# Patient Record
Sex: Female | Born: 1935 | Race: White | Hispanic: No | Smoking: Never smoker
Health system: Southern US, Community
[De-identification: ages and names within clinical notes are randomized; demographics above are authoritative.]

## PROBLEM LIST (undated history)

## (undated) DIAGNOSIS — Z87440 Personal history of urinary (tract) infections: Secondary | ICD-10-CM

## (undated) DIAGNOSIS — R4182 Altered mental status, unspecified: Secondary | ICD-10-CM

## (undated) DIAGNOSIS — F0390 Unspecified dementia without behavioral disturbance: Secondary | ICD-10-CM

## (undated) DIAGNOSIS — R32 Unspecified urinary incontinence: Secondary | ICD-10-CM

## (undated) DIAGNOSIS — F329 Major depressive disorder, single episode, unspecified: Secondary | ICD-10-CM

## (undated) DIAGNOSIS — G43909 Migraine, unspecified, not intractable, without status migrainosus: Secondary | ICD-10-CM

## (undated) DIAGNOSIS — M1990 Unspecified osteoarthritis, unspecified site: Secondary | ICD-10-CM

## (undated) DIAGNOSIS — S4291XA Fracture of right shoulder girdle, part unspecified, initial encounter for closed fracture: Secondary | ICD-10-CM

## (undated) DIAGNOSIS — M6282 Rhabdomyolysis: Secondary | ICD-10-CM

## (undated) DIAGNOSIS — S72009A Fracture of unspecified part of neck of unspecified femur, initial encounter for closed fracture: Secondary | ICD-10-CM

## (undated) DIAGNOSIS — C55 Malignant neoplasm of uterus, part unspecified: Secondary | ICD-10-CM

## (undated) DIAGNOSIS — R159 Full incontinence of feces: Secondary | ICD-10-CM

## (undated) DIAGNOSIS — T148XXA Other injury of unspecified body region, initial encounter: Secondary | ICD-10-CM

## (undated) DIAGNOSIS — F32A Depression, unspecified: Secondary | ICD-10-CM

## (undated) DIAGNOSIS — S62102A Fracture of unspecified carpal bone, left wrist, initial encounter for closed fracture: Secondary | ICD-10-CM

## (undated) DIAGNOSIS — D649 Anemia, unspecified: Secondary | ICD-10-CM

## (undated) DIAGNOSIS — C801 Malignant (primary) neoplasm, unspecified: Secondary | ICD-10-CM

## (undated) DIAGNOSIS — M810 Age-related osteoporosis without current pathological fracture: Secondary | ICD-10-CM

## (undated) DIAGNOSIS — E039 Hypothyroidism, unspecified: Secondary | ICD-10-CM

## (undated) DIAGNOSIS — K2970 Gastritis, unspecified, without bleeding: Secondary | ICD-10-CM

## (undated) HISTORY — PX: ABDOMINAL HYSTERECTOMY: SHX81

## (undated) HISTORY — PX: EYE SURGERY: SHX253

## (undated) HISTORY — PX: FRACTURE SURGERY: SHX138

## (undated) HISTORY — PX: TONSILLECTOMY: SUR1361

---

## 2009-09-15 ENCOUNTER — Emergency Department (HOSPITAL_COMMUNITY): Admission: EM | Admit: 2009-09-15 | Discharge: 2009-09-15 | Payer: Self-pay | Admitting: Emergency Medicine

## 2010-02-14 ENCOUNTER — Inpatient Hospital Stay (HOSPITAL_COMMUNITY): Admission: EM | Admit: 2010-02-14 | Discharge: 2010-02-18 | Payer: Self-pay | Admitting: Emergency Medicine

## 2010-09-25 LAB — URINALYSIS, ROUTINE W REFLEX MICROSCOPIC
Bilirubin Urine: NEGATIVE
Ketones, ur: 40 mg/dL — AB
Nitrite: NEGATIVE
Nitrite: NEGATIVE
Protein, ur: 30 mg/dL — AB
Protein, ur: NEGATIVE mg/dL
Specific Gravity, Urine: 1.031 — ABNORMAL HIGH (ref 1.005–1.030)
Urobilinogen, UA: 1 mg/dL (ref 0.0–1.0)
Urobilinogen, UA: 1 mg/dL (ref 0.0–1.0)
pH: 5.5 (ref 5.0–8.0)

## 2010-09-25 LAB — DIFFERENTIAL
Eosinophils Absolute: 0 10*3/uL (ref 0.0–0.7)
Eosinophils Absolute: 0.1 10*3/uL (ref 0.0–0.7)
Eosinophils Relative: 0 % (ref 0–5)
Eosinophils Relative: 2 % (ref 0–5)
Lymphocytes Relative: 20 % (ref 12–46)
Lymphocytes Relative: 6 % — ABNORMAL LOW (ref 12–46)
Lymphs Abs: 0.6 10*3/uL — ABNORMAL LOW (ref 0.7–4.0)
Lymphs Abs: 1.3 10*3/uL (ref 0.7–4.0)
Monocytes Absolute: 1 10*3/uL (ref 0.1–1.0)
Monocytes Absolute: 1.3 10*3/uL — ABNORMAL HIGH (ref 0.1–1.0)
Monocytes Relative: 15 % — ABNORMAL HIGH (ref 3–12)

## 2010-09-25 LAB — CBC
HCT: 34.9 % — ABNORMAL LOW (ref 36.0–46.0)
Hemoglobin: 13.8 g/dL (ref 12.0–15.0)
MCH: 33.3 pg (ref 26.0–34.0)
MCHC: 34 g/dL (ref 30.0–36.0)
MCHC: 34.1 g/dL (ref 30.0–36.0)
MCV: 98.2 fL (ref 78.0–100.0)
MCV: 99.1 fL (ref 78.0–100.0)
RBC: 3.55 MIL/uL — ABNORMAL LOW (ref 3.87–5.11)
RBC: 4.07 MIL/uL (ref 3.87–5.11)
RDW: 15.2 % (ref 11.5–15.5)

## 2010-09-25 LAB — BASIC METABOLIC PANEL
CO2: 25 mEq/L (ref 19–32)
Chloride: 104 mEq/L (ref 96–112)
Creatinine, Ser: 0.66 mg/dL (ref 0.4–1.2)
Glucose, Bld: 98 mg/dL (ref 70–99)
Sodium: 136 mEq/L (ref 135–145)

## 2010-09-25 LAB — LIPID PANEL
Cholesterol: 134 mg/dL (ref 0–200)
HDL: 41 mg/dL (ref >39)
Total CHOL/HDL Ratio: 3.3 RATIO
VLDL: 21 mg/dL (ref 0–40)

## 2010-09-25 LAB — COMPREHENSIVE METABOLIC PANEL
ALT: 16 U/L (ref 0–35)
AST: 27 U/L (ref 0–37)
Albumin: 3 g/dL — ABNORMAL LOW (ref 3.5–5.2)
Creatinine, Ser: 0.95 mg/dL (ref 0.4–1.2)
GFR calc non Af Amer: 56 mL/min — ABNORMAL LOW (ref >60)
Sodium: 133 mEq/L — ABNORMAL LOW (ref 135–145)
Total Bilirubin: 1.1 mg/dL (ref 0.3–1.2)
Total Protein: 5.7 g/dL — ABNORMAL LOW (ref 6.0–8.3)

## 2010-09-25 LAB — VITAMIN B12: Vitamin B-12: 692 pg/mL (ref 211–911)

## 2010-09-25 LAB — MAGNESIUM: Magnesium: 1.9 mg/dL (ref 1.5–2.5)

## 2010-10-05 LAB — URINALYSIS, ROUTINE W REFLEX MICROSCOPIC
Glucose, UA: NEGATIVE mg/dL
Hgb urine dipstick: NEGATIVE
Specific Gravity, Urine: 1.022 (ref 1.005–1.030)
Urobilinogen, UA: 1 mg/dL (ref 0.0–1.0)
pH: 8 (ref 5.0–8.0)

## 2010-10-05 LAB — DIFFERENTIAL
Basophils Relative: 1 % (ref 0–1)
Eosinophils Absolute: 0.1 10*3/uL (ref 0.0–0.7)
Eosinophils Relative: 3 % (ref 0–5)
Monocytes Absolute: 0.6 10*3/uL (ref 0.1–1.0)
Monocytes Relative: 12 % (ref 3–12)

## 2010-10-05 LAB — BASIC METABOLIC PANEL
CO2: 29 mEq/L (ref 19–32)
Chloride: 107 mEq/L (ref 96–112)
GFR calc Af Amer: >60 mL/min (ref >60)
Sodium: 141 mEq/L (ref 135–145)

## 2010-10-05 LAB — CBC
HCT: 39.7 % (ref 36.0–46.0)
Hemoglobin: 13.6 g/dL (ref 12.0–15.0)
MCHC: 34.3 g/dL (ref 30.0–36.0)
MCV: 97.6 fL (ref 78.0–100.0)
RBC: 4.07 MIL/uL (ref 3.87–5.11)

## 2010-10-05 LAB — POCT CARDIAC MARKERS: CKMB, poc: <1 ng/mL — ABNORMAL LOW (ref 1.0–8.0)

## 2011-03-10 IMAGING — CR DG TIBIA/FIBULA 2V*R*
4 series · 4 of 4 positions shown · non-contrast
Comparison: None.

CLINICAL DATA: Bruising

RIGHT TIBIA AND FIBULA - 2 VIEW

[view not recorded (1 of 4)]
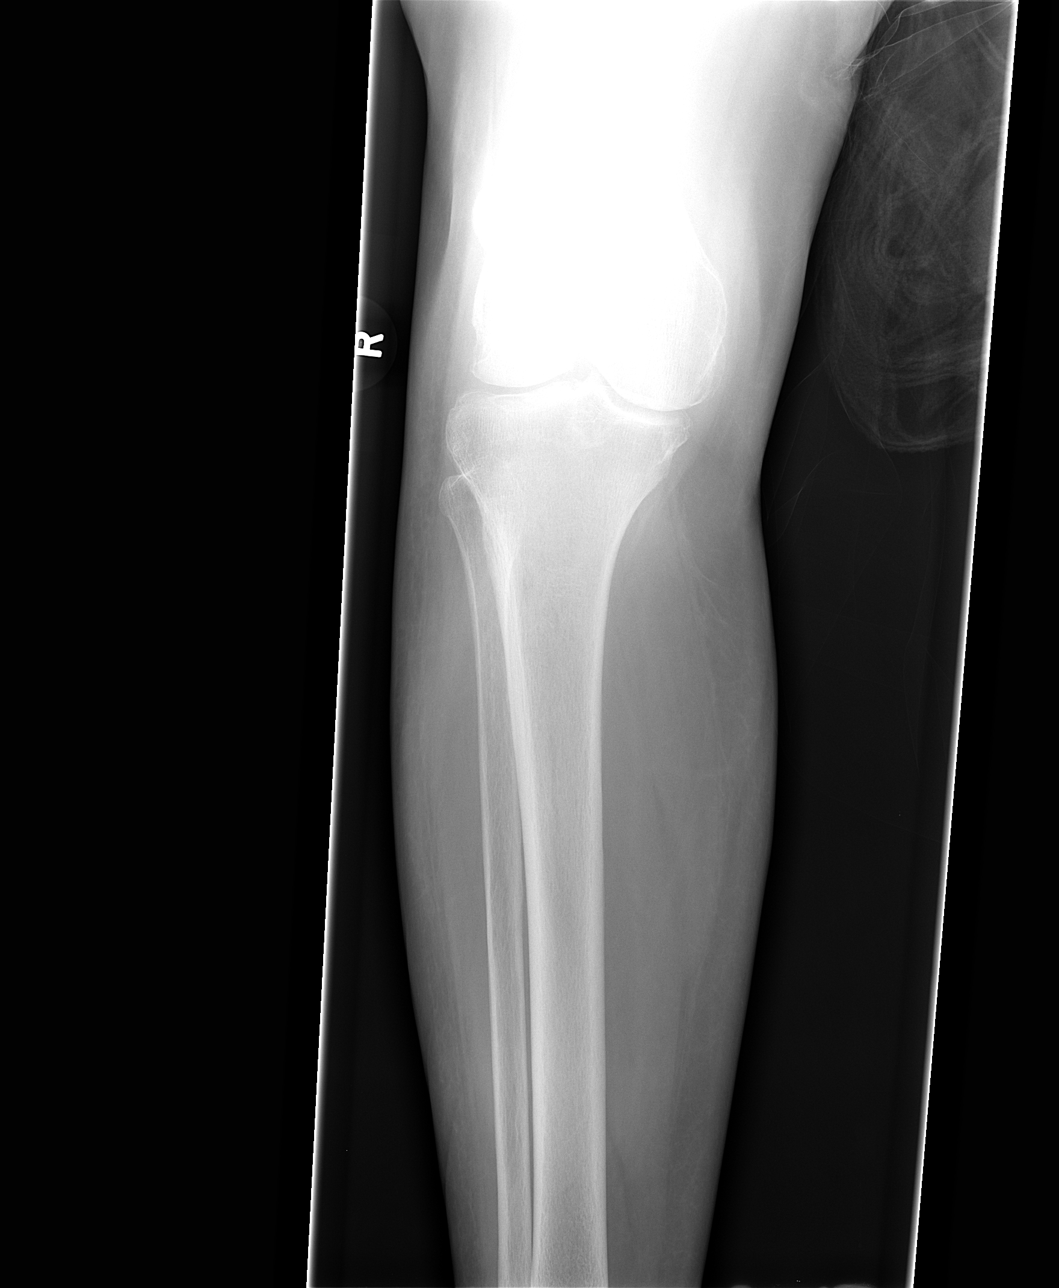

[view not recorded (2 of 4)]
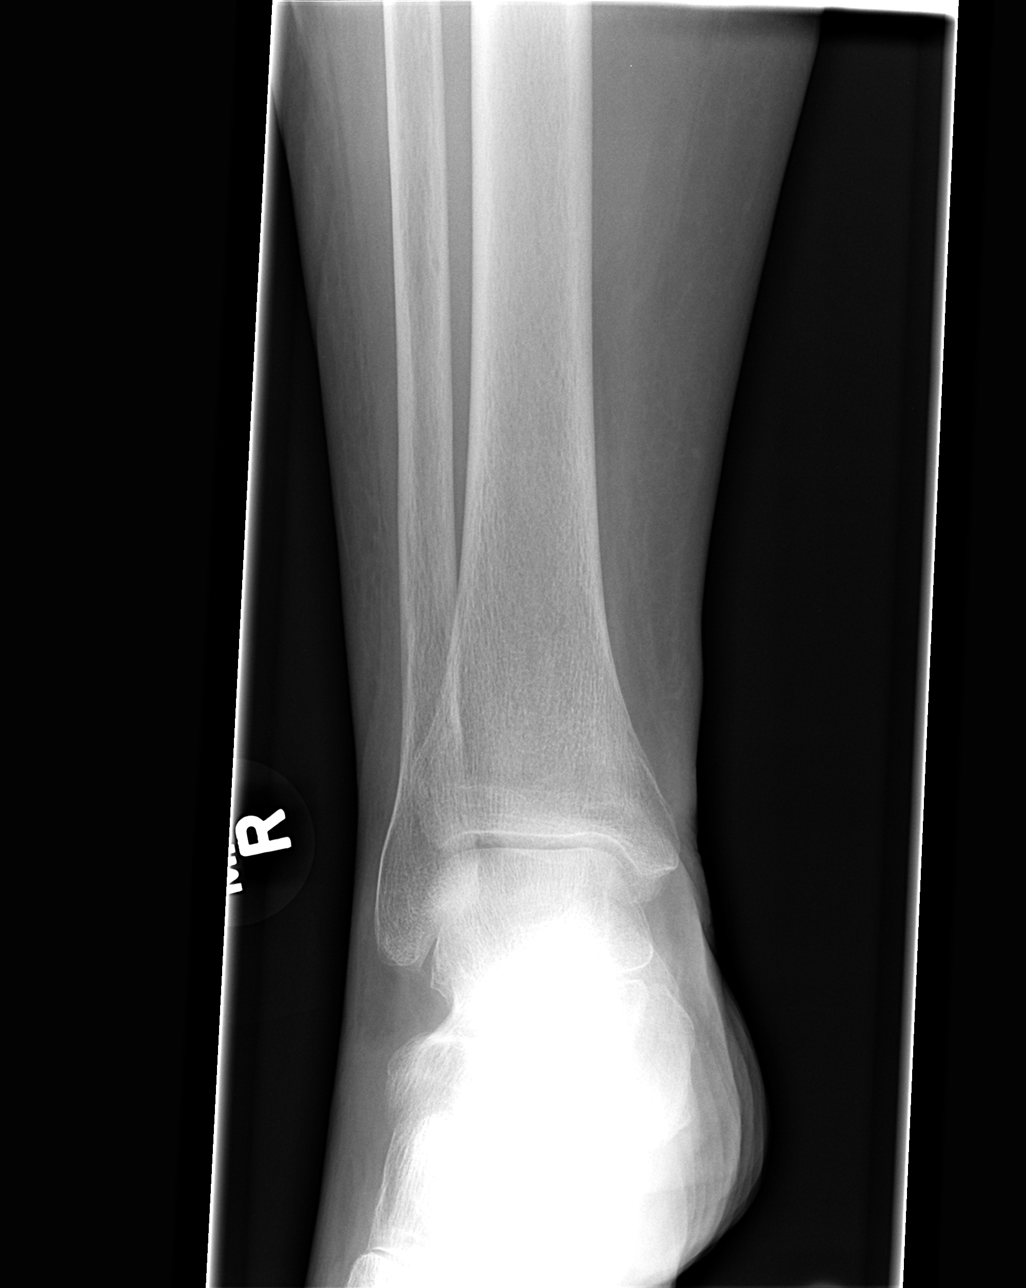

[view not recorded (3 of 4)]
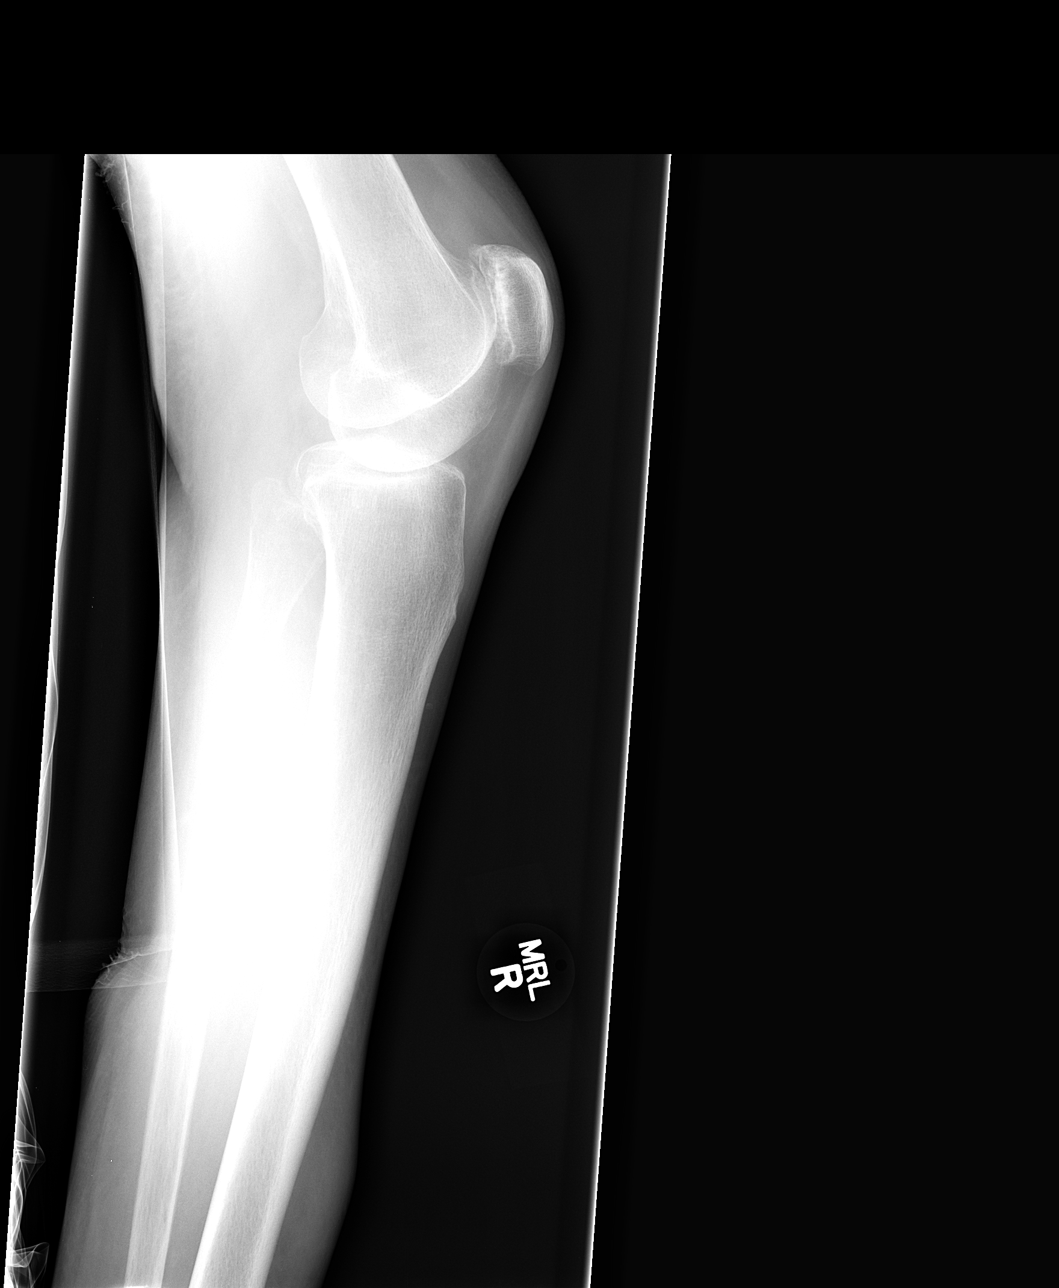

[view not recorded (4 of 4)]
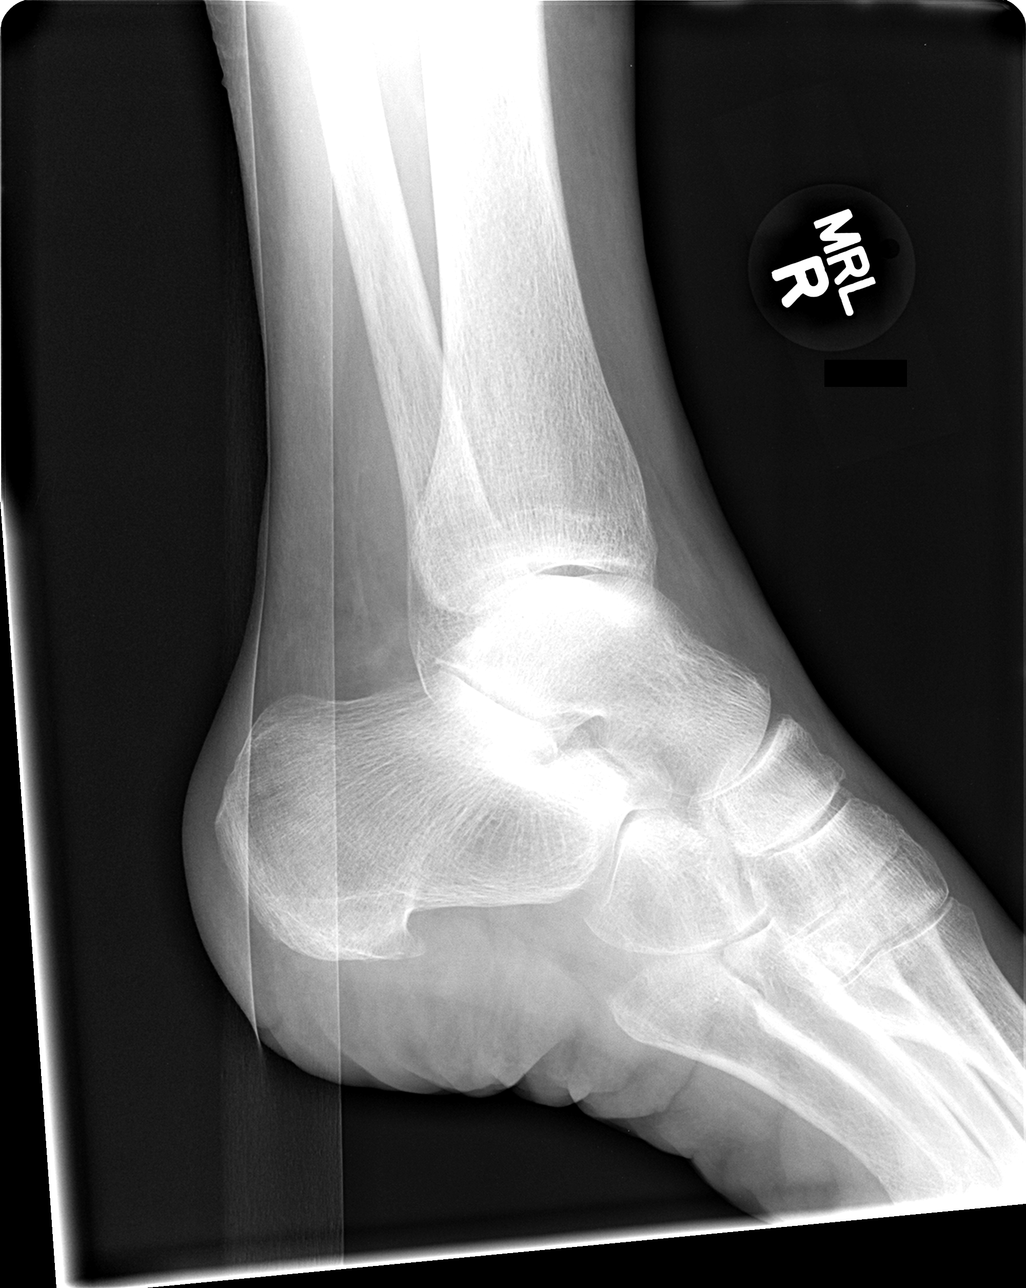

[4 of 4 positions shown; findings below may reference images not displayed]

FINDINGS: No radiodense foreign body or subcutaneous gas.  Small
calcaneal spur. Negative for fracture, dislocation, or other acute
abnormality.  Normal alignment and mineralization.  Regional soft
tissues unremarkable.
IMPRESSION: 1.  Negative for fracture or other acute bone injury.
2.  Small calcaneal spur.

## 2011-04-18 ENCOUNTER — Emergency Department (HOSPITAL_COMMUNITY)
Admission: EM | Admit: 2011-04-18 | Discharge: 2011-04-19 | Disposition: A | Discharge status: Other Acute Inpatient Hospital | Payer: Medicare Other | Attending: Emergency Medicine | Admitting: Emergency Medicine

## 2011-04-18 DIAGNOSIS — IMO0002 Reserved for concepts with insufficient information to code with codable children: Secondary | ICD-10-CM | POA: Insufficient documentation

## 2011-04-18 DIAGNOSIS — F603 Borderline personality disorder: Secondary | ICD-10-CM | POA: Insufficient documentation

## 2011-04-18 DIAGNOSIS — F068 Other specified mental disorders due to known physiological condition: Secondary | ICD-10-CM | POA: Insufficient documentation

## 2011-04-18 DIAGNOSIS — F329 Major depressive disorder, single episode, unspecified: Secondary | ICD-10-CM | POA: Insufficient documentation

## 2011-04-18 DIAGNOSIS — M810 Age-related osteoporosis without current pathological fracture: Secondary | ICD-10-CM | POA: Insufficient documentation

## 2011-04-18 DIAGNOSIS — E039 Hypothyroidism, unspecified: Secondary | ICD-10-CM | POA: Insufficient documentation

## 2011-04-18 DIAGNOSIS — F3289 Other specified depressive episodes: Secondary | ICD-10-CM | POA: Insufficient documentation

## 2011-04-18 DIAGNOSIS — Z79899 Other long term (current) drug therapy: Secondary | ICD-10-CM | POA: Insufficient documentation

## 2011-04-18 LAB — DIFFERENTIAL
Basophils Relative: 0 % (ref 0–1)
Monocytes Relative: 10 % (ref 3–12)
Neutro Abs: 5.2 10*3/uL (ref 1.7–7.7)
Neutrophils Relative %: 63 % (ref 43–77)

## 2011-04-18 LAB — COMPREHENSIVE METABOLIC PANEL
ALT: 10 U/L (ref 0–35)
Alkaline Phosphatase: 97 U/L (ref 39–117)
CO2: 28 mEq/L (ref 19–32)
Calcium: 10 mg/dL (ref 8.4–10.5)
GFR calc Af Amer: 48 mL/min — ABNORMAL LOW (ref >90)
GFR calc non Af Amer: 41 mL/min — ABNORMAL LOW (ref >90)
Glucose, Bld: 85 mg/dL (ref 70–99)
Potassium: 4.2 mEq/L (ref 3.5–5.1)
Sodium: 140 mEq/L (ref 135–145)
Total Bilirubin: 0.6 mg/dL (ref 0.3–1.2)

## 2011-04-18 LAB — CBC
Hemoglobin: 14.1 g/dL (ref 12.0–15.0)
MCH: 30.5 pg (ref 26.0–34.0)
RBC: 4.62 MIL/uL (ref 3.87–5.11)

## 2011-04-18 LAB — RAPID URINE DRUG SCREEN, HOSP PERFORMED
Amphetamines: NOT DETECTED
Tetrahydrocannabinol: NOT DETECTED

## 2011-04-18 LAB — URINALYSIS, ROUTINE W REFLEX MICROSCOPIC
Bilirubin Urine: NEGATIVE
Protein, ur: NEGATIVE mg/dL
Urobilinogen, UA: 0.2 mg/dL (ref 0.0–1.0)

## 2011-04-19 ENCOUNTER — Emergency Department (HOSPITAL_COMMUNITY): Payer: Medicare Other

## 2011-06-01 ENCOUNTER — Emergency Department (HOSPITAL_COMMUNITY): Payer: Medicare Other

## 2011-06-01 ENCOUNTER — Other Ambulatory Visit: Payer: Self-pay

## 2011-06-01 ENCOUNTER — Encounter: Payer: Self-pay | Admitting: Emergency Medicine

## 2011-06-01 ENCOUNTER — Encounter (HOSPITAL_COMMUNITY): Payer: Self-pay | Admitting: Anesthesiology

## 2011-06-01 ENCOUNTER — Encounter (HOSPITAL_COMMUNITY)
Admission: EM | Disposition: A | Discharge status: Skilled Nursing Facility | Payer: Self-pay | Source: Home / Self Care | Attending: Orthopaedic Surgery

## 2011-06-01 ENCOUNTER — Other Ambulatory Visit: Payer: Self-pay | Admitting: Physician Assistant

## 2011-06-01 ENCOUNTER — Inpatient Hospital Stay (HOSPITAL_COMMUNITY)
Admission: EM | Admit: 2011-06-01 | Discharge: 2011-06-07 | DRG: 482 | Disposition: A | Discharge status: Skilled Nursing Facility | Payer: Medicare Other | Attending: Orthopaedic Surgery | Admitting: Orthopaedic Surgery

## 2011-06-01 ENCOUNTER — Emergency Department (HOSPITAL_COMMUNITY): Payer: Medicare Other | Admitting: Anesthesiology

## 2011-06-01 DIAGNOSIS — Y9210 Unspecified residential institution as the place of occurrence of the external cause: Secondary | ICD-10-CM | POA: Diagnosis present

## 2011-06-01 DIAGNOSIS — Z87440 Personal history of urinary (tract) infections: Secondary | ICD-10-CM

## 2011-06-01 DIAGNOSIS — S7223XA Displaced subtrochanteric fracture of unspecified femur, initial encounter for closed fracture: Secondary | ICD-10-CM | POA: Diagnosis present

## 2011-06-01 DIAGNOSIS — R32 Unspecified urinary incontinence: Secondary | ICD-10-CM | POA: Diagnosis present

## 2011-06-01 DIAGNOSIS — S72143A Displaced intertrochanteric fracture of unspecified femur, initial encounter for closed fracture: Principal | ICD-10-CM | POA: Diagnosis present

## 2011-06-01 DIAGNOSIS — F0390 Unspecified dementia without behavioral disturbance: Secondary | ICD-10-CM | POA: Diagnosis present

## 2011-06-01 DIAGNOSIS — S72001A Fracture of unspecified part of neck of right femur, initial encounter for closed fracture: Secondary | ICD-10-CM | POA: Diagnosis present

## 2011-06-01 DIAGNOSIS — W010XXA Fall on same level from slipping, tripping and stumbling without subsequent striking against object, initial encounter: Secondary | ICD-10-CM | POA: Diagnosis present

## 2011-06-01 DIAGNOSIS — S72009A Fracture of unspecified part of neck of unspecified femur, initial encounter for closed fracture: Secondary | ICD-10-CM

## 2011-06-01 HISTORY — DX: Altered mental status, unspecified: R41.82

## 2011-06-01 HISTORY — DX: Unspecified dementia, unspecified severity, without behavioral disturbance, psychotic disturbance, mood disturbance, and anxiety: F03.90

## 2011-06-01 HISTORY — DX: Unspecified osteoarthritis, unspecified site: M19.90

## 2011-06-01 HISTORY — DX: Migraine, unspecified, not intractable, without status migrainosus: G43.909

## 2011-06-01 HISTORY — DX: Unspecified urinary incontinence: R32

## 2011-06-01 HISTORY — PX: FEMUR IM NAIL: SHX1597

## 2011-06-01 HISTORY — DX: Personal history of urinary (tract) infections: Z87.440

## 2011-06-01 HISTORY — DX: Major depressive disorder, single episode, unspecified: F32.9

## 2011-06-01 HISTORY — DX: Hypothyroidism, unspecified: E03.9

## 2011-06-01 HISTORY — DX: Other injury of unspecified body region, initial encounter: T14.8XXA

## 2011-06-01 HISTORY — DX: Full incontinence of feces: R15.9

## 2011-06-01 HISTORY — DX: Depression, unspecified: F32.A

## 2011-06-01 HISTORY — DX: Gastritis, unspecified, without bleeding: K29.70

## 2011-06-01 LAB — DIFFERENTIAL
Basophils Absolute: 0 10*3/uL (ref 0.0–0.1)
Basophils Relative: 1 % (ref 0–1)
Lymphocytes Relative: 21 % (ref 12–46)
Monocytes Absolute: 0.8 10*3/uL (ref 0.1–1.0)
Neutro Abs: 5.6 10*3/uL (ref 1.7–7.7)
Neutrophils Relative %: 64 % (ref 43–77)

## 2011-06-01 LAB — BASIC METABOLIC PANEL
CO2: 27 mEq/L (ref 19–32)
Chloride: 102 mEq/L (ref 96–112)
GFR calc Af Amer: 58 mL/min — ABNORMAL LOW (ref >90)
Potassium: 3.8 mEq/L (ref 3.5–5.1)
Sodium: 140 mEq/L (ref 135–145)

## 2011-06-01 LAB — PROTIME-INR
INR: 0.97 (ref 0.00–1.49)
Prothrombin Time: 13.1 seconds (ref 11.6–15.2)

## 2011-06-01 LAB — CBC
HCT: 41.7 % (ref 36.0–46.0)
Platelets: 223 10*3/uL (ref 150–400)
RDW: 15.4 % (ref 11.5–15.5)
WBC: 8.8 10*3/uL (ref 4.0–10.5)

## 2011-06-01 SURGERY — INSERTION, INTRAMEDULLARY ROD, FEMUR
Anesthesia: General | Site: Hip | Laterality: Right | Wound class: Clean

## 2011-06-01 MED ORDER — WARFARIN SODIUM 5 MG PO TABS
5.0000 mg | ORAL_TABLET | Freq: Once | ORAL | Status: AC
  Administered 2011-06-02: 5 mg via ORAL
  Filled 2011-06-01: qty 1

## 2011-06-01 MED ORDER — HYDROMORPHONE HCL PF 2 MG/ML IJ SOLN
INTRAMUSCULAR | Status: AC
  Administered 2011-06-01: 1 mg via INTRAVENOUS
  Filled 2011-06-01: qty 1

## 2011-06-01 MED ORDER — ARIPIPRAZOLE 5 MG PO TABS
5.0000 mg | ORAL_TABLET | Freq: Every day | ORAL | Status: DC
  Administered 2011-06-02 – 2011-06-07 (×6): 5 mg via ORAL
  Filled 2011-06-01 (×7): qty 1

## 2011-06-01 MED ORDER — HYDROCODONE-ACETAMINOPHEN 5-325 MG PO TABS
1.0000 | ORAL_TABLET | ORAL | Status: DC | PRN
  Administered 2011-06-05 – 2011-06-07 (×4): 1 via ORAL
  Filled 2011-06-01 (×4): qty 1

## 2011-06-01 MED ORDER — BISACODYL 10 MG RE SUPP: 10.0000 mg | Freq: Every day | RECTAL | Status: DC | PRN

## 2011-06-01 MED ORDER — METOCLOPRAMIDE HCL 10 MG PO TABS: 5.0000 mg | ORAL_TABLET | Freq: Three times a day (TID) | ORAL | Status: DC | PRN

## 2011-06-01 MED ORDER — DIVALPROEX SODIUM ER 500 MG PO TB24
500.0000 mg | ORAL_TABLET | Freq: Every day | ORAL | Status: DC
  Administered 2011-06-01 – 2011-06-06 (×6): 500 mg via ORAL
  Filled 2011-06-01 (×9): qty 1

## 2011-06-01 MED ORDER — ONDANSETRON HCL 4 MG/2ML IJ SOLN
INTRAMUSCULAR | Status: DC | PRN
  Administered 2011-06-01: 4 mg via INTRAVENOUS

## 2011-06-01 MED ORDER — METHOCARBAMOL 100 MG/ML IJ SOLN: 500.0000 mg | Freq: Four times a day (QID) | INTRAVENOUS | Status: DC | PRN

## 2011-06-01 MED ORDER — ACETAMINOPHEN 325 MG PO TABS
650.0000 mg | ORAL_TABLET | Freq: Four times a day (QID) | ORAL | Status: DC | PRN
  Administered 2011-06-02 – 2011-06-04 (×5): 650 mg via ORAL
  Filled 2011-06-01 (×5): qty 2

## 2011-06-01 MED ORDER — LORAZEPAM 0.5 MG PO TABS
0.5000 mg | ORAL_TABLET | Freq: Two times a day (BID) | ORAL | Status: DC
  Administered 2011-06-01 – 2011-06-07 (×8): 0.5 mg via ORAL
  Filled 2011-06-01 (×10): qty 1

## 2011-06-01 MED ORDER — MORPHINE SULFATE 2 MG/ML IJ SOLN
0.5000 mg | INTRAMUSCULAR | Status: DC | PRN
  Administered 2011-06-02: 0.5 mg via INTRAVENOUS
  Filled 2011-06-01: qty 1

## 2011-06-01 MED ORDER — HYDROCORTISONE 2.5 % RE CREA: 1.0000 "application " | TOPICAL_CREAM | RECTAL | Status: DC

## 2011-06-01 MED ORDER — ONDANSETRON HCL 4 MG/2ML IJ SOLN
4.0000 mg | Freq: Four times a day (QID) | INTRAMUSCULAR | Status: DC | PRN
Start: 2011-06-01 — End: 2011-06-03

## 2011-06-01 MED ORDER — LACTATED RINGERS IV SOLN: INTRAVENOUS | Status: DC

## 2011-06-01 MED ORDER — LACTATED RINGERS IV SOLN
INTRAVENOUS | Status: DC | PRN
  Administered 2011-06-01 (×2): via INTRAVENOUS

## 2011-06-01 MED ORDER — METHOCARBAMOL 100 MG/ML IJ SOLN
500.0000 mg | Freq: Four times a day (QID) | INTRAVENOUS | Status: DC | PRN
  Filled 2011-06-01: qty 5

## 2011-06-01 MED ORDER — ONDANSETRON HCL 4 MG/2ML IJ SOLN
4.0000 mg | Freq: Once | INTRAMUSCULAR | Status: AC
  Administered 2011-06-01: 4 mg via INTRAVENOUS
  Filled 2011-06-01: qty 2

## 2011-06-01 MED ORDER — HYDROMORPHONE HCL PF 1 MG/ML IJ SOLN: 1.0000 mg | Freq: Once | INTRAMUSCULAR | Status: DC

## 2011-06-01 MED ORDER — SODIUM CHLORIDE 0.9 % IV SOLN: INTRAVENOUS | Status: DC

## 2011-06-01 MED ORDER — CALCIUM CARBONATE-VITAMIN D 500-200 MG-UNIT PO TABS
1.0000 | ORAL_TABLET | Freq: Every day | ORAL | Status: DC
  Administered 2011-06-01 – 2011-06-07 (×7): 1 via ORAL
  Filled 2011-06-01 (×8): qty 1

## 2011-06-01 MED ORDER — DONEPEZIL HCL 10 MG PO TABS
10.0000 mg | ORAL_TABLET | Freq: Every day | ORAL | Status: DC
  Administered 2011-06-01 – 2011-06-06 (×6): 10 mg via ORAL
  Filled 2011-06-01 (×8): qty 1

## 2011-06-01 MED ORDER — POLYETHYLENE GLYCOL 3350 17 G PO PACK
17.0000 g | PACK | Freq: Every day | ORAL | Status: DC | PRN
  Filled 2011-06-01: qty 1

## 2011-06-01 MED ORDER — METOCLOPRAMIDE HCL 5 MG/ML IJ SOLN: 5.0000 mg | Freq: Three times a day (TID) | INTRAMUSCULAR | Status: DC | PRN

## 2011-06-01 MED ORDER — ACETAMINOPHEN 10 MG/ML IV SOLN
INTRAVENOUS | Status: AC
  Filled 2011-06-01: qty 100

## 2011-06-01 MED ORDER — CEFAZOLIN SODIUM 1-5 GM-% IV SOLN
1.0000 g | INTRAVENOUS | Status: AC
  Administered 2011-06-01: 1 g via INTRAVENOUS

## 2011-06-01 MED ORDER — CEFAZOLIN SODIUM 1-5 GM-% IV SOLN
INTRAVENOUS | Status: AC
  Filled 2011-06-01: qty 50

## 2011-06-01 MED ORDER — VITAMIN B-12 1000 MCG PO TABS
1000.0000 ug | ORAL_TABLET | Freq: Two times a day (BID) | ORAL | Status: DC
  Administered 2011-06-01 – 2011-06-07 (×12): 1000 ug via ORAL
  Filled 2011-06-01 (×16): qty 1

## 2011-06-01 MED ORDER — PHENOL 1.4 % MT LIQD: 1.0000 | OROMUCOSAL | Status: DC | PRN

## 2011-06-01 MED ORDER — HYDROMORPHONE HCL PF 1 MG/ML IJ SOLN: 0.2500 mg | INTRAMUSCULAR | Status: DC | PRN

## 2011-06-01 MED ORDER — MAGNESIUM HYDROXIDE 400 MG/5ML PO SUSP: 30.0000 mL | Freq: Two times a day (BID) | ORAL | Status: DC | PRN

## 2011-06-01 MED ORDER — PROPOFOL 10 MG/ML IV EMUL
INTRAVENOUS | Status: DC | PRN
  Administered 2011-06-01: 40 mg via INTRAVENOUS
  Administered 2011-06-01: 120 mg via INTRAVENOUS
  Administered 2011-06-01: 40 mg via INTRAVENOUS
  Administered 2011-06-01: 20 mg via INTRAVENOUS

## 2011-06-01 MED ORDER — DIVALPROEX SODIUM ER 250 MG PO TB24
250.0000 mg | ORAL_TABLET | Freq: Every day | ORAL | Status: DC
  Administered 2011-06-02 – 2011-06-07 (×6): 250 mg via ORAL
  Filled 2011-06-01 (×7): qty 1

## 2011-06-01 MED ORDER — FENTANYL CITRATE 0.05 MG/ML IJ SOLN
50.0000 ug | Freq: Once | INTRAMUSCULAR | Status: AC
  Administered 2011-06-01: 50 ug via INTRAVENOUS
  Filled 2011-06-01: qty 2

## 2011-06-01 MED ORDER — LIDOCAINE HCL (CARDIAC) 20 MG/ML IV SOLN
INTRAVENOUS | Status: DC | PRN
  Administered 2011-06-01: 60 mg via INTRAVENOUS

## 2011-06-01 MED ORDER — ACETAMINOPHEN 650 MG RE SUPP: 650.0000 mg | Freq: Four times a day (QID) | RECTAL | Status: DC | PRN

## 2011-06-01 MED ORDER — ASPIRIN 81 MG PO CHEW
81.0000 mg | CHEWABLE_TABLET | Freq: Every day | ORAL | Status: DC
  Administered 2011-06-01 – 2011-06-07 (×7): 81 mg via ORAL
  Filled 2011-06-01 (×8): qty 1

## 2011-06-01 MED ORDER — SERTRALINE HCL 100 MG PO TABS
100.0000 mg | ORAL_TABLET | Freq: Every day | ORAL | Status: DC
  Administered 2011-06-01 – 2011-06-03 (×3): 100 mg via ORAL
  Administered 2011-06-04: 23:00:00 via ORAL
  Administered 2011-06-05 – 2011-06-06 (×2): 100 mg via ORAL
  Filled 2011-06-01 (×8): qty 1

## 2011-06-01 MED ORDER — PATIENT'S GUIDE TO USING COUMADIN BOOK
Freq: Once | Status: AC
  Administered 2011-06-01: 23:00:00
  Filled 2011-06-01: qty 1

## 2011-06-01 MED ORDER — EPHEDRINE SULFATE 50 MG/ML IJ SOLN
INTRAMUSCULAR | Status: DC | PRN
  Administered 2011-06-01: 10 mg via INTRAVENOUS
  Administered 2011-06-01 (×10): 5 mg via INTRAVENOUS

## 2011-06-01 MED ORDER — MIRTAZAPINE 15 MG PO TABS
15.0000 mg | ORAL_TABLET | Freq: Every day | ORAL | Status: DC
  Administered 2011-06-01 – 2011-06-06 (×6): 15 mg via ORAL
  Filled 2011-06-01 (×8): qty 1

## 2011-06-01 MED ORDER — BISACODYL 5 MG PO TBEC: 10.0000 mg | DELAYED_RELEASE_TABLET | Freq: Every day | ORAL | Status: DC | PRN

## 2011-06-01 MED ORDER — DOCUSATE SODIUM 100 MG PO CAPS
100.0000 mg | ORAL_CAPSULE | Freq: Two times a day (BID) | ORAL | Status: DC
  Administered 2011-06-01 – 2011-06-07 (×12): 100 mg via ORAL
  Filled 2011-06-01 (×15): qty 1

## 2011-06-01 MED ORDER — SUCRALFATE 1 G PO TABS
1.0000 g | ORAL_TABLET | Freq: Two times a day (BID) | ORAL | Status: DC
  Administered 2011-06-01 – 2011-06-07 (×12): 1 g via ORAL
  Filled 2011-06-01 (×15): qty 1

## 2011-06-01 MED ORDER — CEFAZOLIN SODIUM 1-5 GM-% IV SOLN
1.0000 g | Freq: Four times a day (QID) | INTRAVENOUS | Status: AC
  Administered 2011-06-01 – 2011-06-02 (×3): 1 g via INTRAVENOUS
  Filled 2011-06-01 (×3): qty 50

## 2011-06-01 MED ORDER — ESMOLOL HCL 10 MG/ML IV SOLN
INTRAVENOUS | Status: DC | PRN
  Administered 2011-06-01: 20 mg via INTRAVENOUS

## 2011-06-01 MED ORDER — METOCLOPRAMIDE HCL 5 MG/ML IJ SOLN
INTRAMUSCULAR | Status: DC | PRN
  Administered 2011-06-01: 10 mg via INTRAVENOUS

## 2011-06-01 MED ORDER — SUCCINYLCHOLINE CHLORIDE 20 MG/ML IJ SOLN
INTRAMUSCULAR | Status: DC | PRN
  Administered 2011-06-01: 100 mg via INTRAVENOUS

## 2011-06-01 MED ORDER — METHOCARBAMOL 500 MG PO TABS: 500.0000 mg | ORAL_TABLET | Freq: Four times a day (QID) | ORAL | Status: DC | PRN

## 2011-06-01 MED ORDER — PROMETHAZINE HCL 25 MG/ML IJ SOLN: 6.2500 mg | INTRAMUSCULAR | Status: DC | PRN

## 2011-06-01 MED ORDER — LORAZEPAM 0.5 MG PO TABS: 0.5000 mg | ORAL_TABLET | Freq: Two times a day (BID) | ORAL | Status: DC | PRN

## 2011-06-01 MED ORDER — MENTHOL 3 MG MT LOZG: 1.0000 | LOZENGE | OROMUCOSAL | Status: DC | PRN

## 2011-06-01 MED ORDER — WARFARIN VIDEO
Freq: Once | Status: AC
  Administered 2011-06-02: 12:00:00
  Filled 2011-06-01: qty 1

## 2011-06-01 MED ORDER — MEMANTINE HCL 10 MG PO TABS
10.0000 mg | ORAL_TABLET | Freq: Two times a day (BID) | ORAL | Status: DC
  Administered 2011-06-01 – 2011-06-07 (×12): 10 mg via ORAL
  Filled 2011-06-01 (×15): qty 1

## 2011-06-01 MED ORDER — ALUM & MAG HYDROXIDE-SIMETH 200-200-20 MG/5ML PO SUSP: 30.0000 mL | ORAL | Status: DC | PRN

## 2011-06-01 MED ORDER — ONDANSETRON HCL 4 MG PO TABS: 4.0000 mg | ORAL_TABLET | Freq: Four times a day (QID) | ORAL | Status: DC | PRN

## 2011-06-01 MED ORDER — LEVOTHYROXINE SODIUM 150 MCG PO TABS
150.0000 ug | ORAL_TABLET | Freq: Every day | ORAL | Status: DC
  Administered 2011-06-01 – 2011-06-07 (×6): 150 ug via ORAL
  Filled 2011-06-01 (×7): qty 1

## 2011-06-01 MED ORDER — FENTANYL CITRATE 0.05 MG/ML IJ SOLN
INTRAMUSCULAR | Status: DC | PRN
  Administered 2011-06-01: 100 ug via INTRAVENOUS
  Administered 2011-06-01 (×3): 50 ug via INTRAVENOUS

## 2011-06-01 MED ORDER — ACETAMINOPHEN 10 MG/ML IV SOLN
INTRAVENOUS | Status: DC | PRN
  Administered 2011-06-01: 1000 mg via INTRAVENOUS

## 2011-06-01 MED ORDER — ENOXAPARIN SODIUM 40 MG/0.4ML ~~LOC~~ SOLN
40.0000 mg | SUBCUTANEOUS | Status: DC
  Administered 2011-06-02 – 2011-06-03 (×2): 40 mg via SUBCUTANEOUS
  Filled 2011-06-01 (×3): qty 0.4

## 2011-06-01 MED ORDER — FLEET ENEMA 7-19 GM/118ML RE ENEM: 1.0000 | ENEMA | Freq: Every day | RECTAL | Status: DC | PRN

## 2011-06-01 MED ORDER — SODIUM CHLORIDE 0.9 % IV SOLN
Freq: Once | INTRAVENOUS | Status: AC
  Administered 2011-06-01: 09:00:00 via INTRAVENOUS

## 2011-06-01 MED ORDER — HYDROMORPHONE HCL PF 2 MG/ML IJ SOLN: 0.5000 mg | INTRAMUSCULAR | Status: DC | PRN

## 2011-06-01 SURGICAL SUPPLY — 36 items
BAG ZIPLOCK 12X15 (MISCELLANEOUS) ×2 IMPLANT
BANDAGE GAUZE ELAST BULKY 4 IN (GAUZE/BANDAGES/DRESSINGS) ×2 IMPLANT
BIT DRILL 4.3MMS DISTAL GRDTED (BIT) ×1 IMPLANT
CLOTH BEACON ORANGE TIMEOUT ST (SAFETY) ×2 IMPLANT
COVER MAYO STAND STRL (DRAPES) ×2 IMPLANT
DRAPE STERI IOBAN 125X83 (DRAPES) ×2 IMPLANT
DRILL 4.3MMS DISTAL GRADUATED (BIT) ×2
DRSG PAD ABDOMINAL 8X10 ST (GAUZE/BANDAGES/DRESSINGS) ×4 IMPLANT
DURAPREP 26ML APPLICATOR (WOUND CARE) ×2 IMPLANT
ELECT REM PT RETURN 9FT ADLT (ELECTROSURGICAL) ×2
ELECTRODE REM PT RTRN 9FT ADLT (ELECTROSURGICAL) ×1 IMPLANT
FACESHIELD LNG OPTICON STERILE (SAFETY) ×4 IMPLANT
GLOVE ORTHO TXT STRL SZ7.5 (GLOVE) ×2 IMPLANT
GOWN PREVENTION PLUS LG XLONG (DISPOSABLE) ×2 IMPLANT
GUIDEPIN 3.2X17.5 THRD DISP (PIN) ×2 IMPLANT
GUIDEWIRE BALL NOSE 80CM (WIRE) ×2 IMPLANT
HFN RH 130 DEG 9MM X 360MM (Nail) ×2 IMPLANT
HIP FR NAIL LAG SCREW 10.5X110 (Orthopedic Implant) ×2 IMPLANT
NS IRRIG 1000ML POUR BTL (IV SOLUTION) ×2 IMPLANT
PACK GENERAL/GYN (CUSTOM PROCEDURE TRAY) ×2 IMPLANT
PAD CAST 4YDX4 CTTN HI CHSV (CAST SUPPLIES) ×1 IMPLANT
PADDING CAST COTTON 4X4 STRL (CAST SUPPLIES) ×1
PADDING CAST COTTON 6X4 STRL (CAST SUPPLIES) ×2 IMPLANT
POSITIONER SURGICAL ARM (MISCELLANEOUS) ×2 IMPLANT
SCREW BONE CORTICAL 5.0X44 (Screw) ×2 IMPLANT
SCREW LAG HIP FR NAIL 10.5X110 (Orthopedic Implant) ×1 IMPLANT
SPONGE GAUZE 4X4 12PLY (GAUZE/BANDAGES/DRESSINGS) ×2 IMPLANT
STAPLER VISISTAT 35W (STAPLE) ×2 IMPLANT
SUT VIC AB 1 CT1 27 (SUTURE) ×2
SUT VIC AB 1 CT1 27XBRD ANTBC (SUTURE) ×2 IMPLANT
SUT VIC AB 2-0 CT1 27 (SUTURE) ×2
SUT VIC AB 2-0 CT1 27XBRD (SUTURE) ×2 IMPLANT
TAPE HYPAFIX 4 X10 (GAUZE/BANDAGES/DRESSINGS) ×2 IMPLANT
TOWEL OR 17X26 10 PK STRL BLUE (TOWEL DISPOSABLE) ×2 IMPLANT
TRAY FOLEY CATH 14FRSI W/METER (CATHETERS) ×2 IMPLANT
WATER STERILE IRR 1500ML POUR (IV SOLUTION) ×2 IMPLANT

## 2011-06-01 NOTE — Preoperative (Signed)
Beta Blockers   Reason not to administer Beta Blockers:Not Applicable, pt does not take home BB

## 2011-06-01 NOTE — Brief Op Note (Signed)
06/01/2011  7:12 PM  PATIENT:  Bonnie Riley  77 y.o. female  PRE-OPERATIVE DIAGNOSIS:  Right Hip Fracture  POST-OPERATIVE DIAGNOSIS:  Right Hip Fracture  PROCEDURE:  Procedure(s): INTRAMEDULLARY (IM) NAIL FEMORAL (intramedullary hip screw biomet 9 X 360 nail 110 lag screw 44mm distal interlock)  SURGEON:  Surgeon(s): Eldred Manges  PHYSICIAN ASSISTANT:   ASSISTANTS: none   ANESTHESIA:   general  EBL:     BLOOD ADMINISTERED:none  DRAINS: none   LOCAL MEDICATIONS USED:  NONE  SPECIMEN:  No Specimen  DISPOSITION OF SPECIMEN:  N/A  COUNTS:  YES  TOURNIQUET:  * No tourniquets in log *  DICTATION: .Other Dictation: Dictation Number [id number]  PLAN OF CARE: Admit to inpatient   PATIENT DISPOSITION:  PACU - hemodynamically stable.   Delay start of Pharmacological VTE agent (>24hrs) due to surgical blood loss or risk of bleeding:  {YES/NO/NOT APPLICABLE:20182

## 2011-06-01 NOTE — ED Notes (Signed)
Fell in the bathroom this am and hit the right hip on the floor. Outward rotation and shortening noted. A&O x4. Pulse in right foot strong.

## 2011-06-01 NOTE — ED Notes (Signed)
Patient transported to X-ray 

## 2011-06-01 NOTE — Anesthesia Postprocedure Evaluation (Signed)
  Anesthesia Post-op Note  Patient: Bonnie Riley  Procedure(s) Performed:  INTRAMEDULLARY (IM) NAIL FEMORAL  Patient Location: PACU  Anesthesia Type: General  Level of Consciousness: awake and alert   Airway and Oxygen Therapy: Patient Spontanous Breathing  Post-op Pain: mild  Post-op Assessment: Post-op Vital signs reviewed, Patient's Cardiovascular Status Stable, Respiratory Function Stable, Patent Airway and No signs of Nausea or vomiting  Post-op Vital Signs: stable  Complications: No apparent anesthesia complications

## 2011-06-01 NOTE — Anesthesia Preprocedure Evaluation (Addendum)
Anesthesia Evaluation  Patient identified by MRN, date of birth, ID band Patient awake    Reviewed: Allergy & Precautions, H&P , NPO status , Patient's Chart, lab work & pertinent test results, reviewed documented beta blocker date and time   Airway Mallampati: II TM Distance: >3 FB Neck ROM: full    Dental No notable dental hx. (+) Teeth Intact and Dental Advisory Given   Pulmonary neg pulmonary ROS,  clear to auscultation  Pulmonary exam normal       Cardiovascular Exercise Tolerance: Good neg cardio ROS regular Normal    Neuro/Psych dementia Negative Neurological ROS  Negative Psych ROS   GI/Hepatic negative GI ROS, Neg liver ROS,   Endo/Other  Negative Endocrine ROS  Renal/GU negative Renal ROS  Genitourinary negative   Musculoskeletal   Abdominal Normal abdominal exam  (+)   Peds  Hematology negative hematology ROS (+)   Anesthesia Other Findings   Reproductive/Obstetrics negative OB ROS                         Anesthesia Physical Anesthesia Plan  ASA: II  Anesthesia Plan: General   Post-op Pain Management:    Induction: Intravenous  Airway Management Planned: Oral ETT  Additional Equipment:   Intra-op Plan:   Post-operative Plan: Extubation in OR  Informed Consent: I have reviewed the patients History and Physical, chart, labs and discussed the procedure including the risks, benefits and alternatives for the proposed anesthesia with the patient or authorized representative who has indicated his/her understanding and acceptance.   Dental Advisory Given  Plan Discussed with: CRNA and Surgeon  Anesthesia Plan Comments:       Anesthesia Quick Evaluation

## 2011-06-01 NOTE — Progress Notes (Signed)
ANTICOAGULATION CONSULT NOTE - Initial Consult  Pharmacy Consult for Coumadin Indication: VTE prophylaxis  No Known Allergies  Patient Measurements: Weight: 155 lb (70.308 kg)   Vital Signs: Temp: 97.4 F (36.3 C) (11/20 2037) Temp src: Oral (11/20 1945) BP: 101/68 mmHg (11/20 2037) Pulse Rate: 86  (11/20 2037)  Labs:  Basename 06/01/11 0910  HGB 13.6  HCT 41.7  PLT 223  APTT --  LABPROT 13.1  INR 0.97  HEPARINUNFRC --  CREATININE 1.01  CKTOTAL --  CKMB --  TROPONINI --   The CrCl is unknown because both a height and weight (above a minimum accepted value) are required for this calculation.  Medical History: Past Medical History  Diagnosis Date  . Dementia   . Altered mental status   . Depression   . Hypothyroidism   . Gastritis   . Arthritis   . Migraines   . History of recurrent UTIs   . Alteration in bowel elimination: incontinence   . Urinary bladder incontinence   . Dementia   . Hematoma     Hx of right shin hematoma    Medications:  Scheduled:    . sodium chloride   Intravenous Once  . ARIPiprazole  5 mg Oral Daily  . aspirin  81 mg Oral Daily  . calcium-vitamin D  1 tablet Oral Daily  . ceFAZolin (ANCEF) IV  1 g Intravenous 60 min Pre-Op  . ceFAZolin (ANCEF) IV  1 g Intravenous Q6H  . divalproex  250 mg Oral Daily  . divalproex  500 mg Oral QHS  . docusate sodium  100 mg Oral BID  . donepezil  10 mg Oral QHS  . enoxaparin  40 mg Subcutaneous Q24H  . fentaNYL  50 mcg Intravenous Once  . hydrocortisone  1 application Rectal UD  . HYDROmorphone      .  HYDROmorphone (DILAUDID) injection  1 mg Intravenous Once  . levothyroxine  150 mcg Oral QAC breakfast  . LORazepam  0.5 mg Oral BID  . memantine  10 mg Oral BID  . mirtazapine  15 mg Oral QHS  . ondansetron (ZOFRAN) IV  4 mg Intravenous Once  . sertraline  100 mg Oral QHS  . sucralfate  1 g Oral BID  . vitamin B-12  1,000 mcg Oral BID   Infusions:    . sodium chloride    . lactated  ringers      Assessment: 77 yo s/p right hip fracture. Goal of Therapy:  INR 2-3   Plan:  Coumadin  5mg  x1 tonight. Daily PT/INR. Education.  Susanne Greenhouse R 06/01/2011,10:14 PM

## 2011-06-01 NOTE — ED Provider Notes (Signed)
History     CSN: [id number] Arrival date & time: 06/01/2011  9:00 AM   First MD Initiated Contact with Patient 06/01/11 [phone]      Chief Complaint  Patient presents with  . Hip Pain    (Consider location/radiation/quality/duration/timing/severity/associated sxs/prior treatment) HPI Comments: Patient presents from assisted living facility after mechanical fall. She states that she tripped in the bathroom and found her right side. She denies hitting her head or losing consciousness. She has pain in her right hip and is unable to bear weight. She denies any weakness, numbness, tingling. She denies any headache, neck pain, chest pain or back pain or abdominal pain. She does not believe she is on any anticoagulation.  Denies any dizziness, lightheadedness, vision change, syncope.   Patient is a 77 y.o. female presenting with hip pain. The history is provided by the patient and the EMS personnel.  Hip Pain This is a new problem. The current episode started 1 to 2 hours ago. The problem occurs constantly. The problem has not changed since onset.Pertinent negatives include no chest pain, no abdominal pain, no headaches and no shortness of breath. The symptoms are aggravated by twisting and walking. She has tried nothing for the symptoms.    Past Medical History  Diagnosis Date  . Dementia   . Altered mental status   . Depression   . Hypothyroidism   . Gastritis   . Arthritis   . Migraines   . History of recurrent UTIs   . Alteration in bowel elimination: incontinence   . Urinary bladder incontinence   . Dementia   . Hematoma     Hx of right shin hematoma    Past Surgical History  Procedure Date  . Tonsillectomy     History reviewed. No pertinent family history.  History  Substance Use Topics  . Smoking status: Never Smoker   . Smokeless tobacco: Never Used  . Alcohol Use: No    OB History    Grav Para Term Preterm Abortions TAB SAB Ect Mult Living                   Review of Systems  Constitutional: Negative for activity change and appetite change.  HENT: Negative for congestion and rhinorrhea.   Respiratory: Negative for cough, chest tightness and shortness of breath.   Cardiovascular: Negative for chest pain.  Gastrointestinal: Negative for nausea, vomiting and abdominal pain.  Genitourinary: Negative for dysuria and hematuria.  Musculoskeletal: Positive for myalgias and arthralgias. Negative for back pain.  Skin: Negative for rash.  Neurological: Negative for dizziness, weakness and headaches.    Allergies  Review of patient's allergies indicates no known allergies.  Home Medications   Current Outpatient Rx  Name Route Sig Dispense Refill  . ALENDRONATE SODIUM 70 MG PO TABS Oral Take 70 mg by mouth every 7 (seven) days. Take with a full glass of water on an empty stomach.     . ARIPIPRAZOLE 5 MG PO TABS Oral Take 5 mg by mouth every morning.      . ASPIRIN 81 MG PO CHEW Oral Chew 81 mg by mouth daily.      Marland Kitchen CALTRATE 600 PLUS-VIT D PO Oral Take 600 mg by mouth daily.      Marland Kitchen DIVALPROEX SODIUM 250 MG PO TB24 Oral Take 500 mg by mouth at bedtime.      Marland Kitchen DIVALPROEX SODIUM 250 MG PO TB24 Oral Take 250 mg by mouth every morning.      Marland Kitchen  DONEPEZIL HCL 10 MG PO TABS Oral Take 10 mg by mouth at bedtime.      Donata Duff CITRATE 0.05 MG/ML IJ SOLN Intravenous Inject 50 mcg into the vein once.      Marland Kitchen LEVOTHYROXINE SODIUM 150 MCG PO TABS Oral Take 150 mcg by mouth daily.      Marland Kitchen LORAZEPAM 0.5 MG PO TABS Oral Take 0.5 mg by mouth every 12 (twelve) hours as needed. For anxiety or agitation     . LORAZEPAM 0.5 MG PO TABS Oral Take 0.5 mg by mouth 2 (two) times daily.      Marland Kitchen MEMANTINE HCL 10 MG PO TABS Oral Take 10 mg by mouth 2 (two) times daily.      Marland Kitchen MIRTAZAPINE 15 MG PO TABS Oral Take 15 mg by mouth at bedtime.      Marland Kitchen ONE-DAILY MULTI VITAMINS PO TABS Oral Take 1 tablet by mouth daily.      . SERTRALINE HCL 100 MG PO TABS Oral Take 100 mg by mouth at  bedtime.      . SUCRALFATE 1 G PO TABS Oral Take 1 g by mouth 2 (two) times daily.      Marland Kitchen VITAMIN B-12 1000 MCG PO TABS Oral Take 1,000 mcg by mouth 2 (two) times daily.      Marland Kitchen HYDROCORTISONE 2.5 % RE CREA Rectal Place 1 application rectally as directed. For rectal discomfort.       BP 156/75  Wt 155 lb (70.308 kg)  SpO2 99%  Physical Exam  Constitutional: She is oriented to person, place, and time. She appears well-developed and well-nourished. No distress.  HENT:  Head: Normocephalic and atraumatic.  Mouth/Throat: Oropharynx is clear and moist. No oropharyngeal exudate.  Eyes: Pupils are equal, round, and reactive to light.  Neck: Normal range of motion.  Cardiovascular: Normal rate, regular rhythm and normal heart sounds.   Pulmonary/Chest: Effort normal and breath sounds normal. No respiratory distress.  Abdominal: Soft. There is no tenderness. There is no rebound and no guarding.  Musculoskeletal: She exhibits tenderness. She exhibits no edema.       RLE shortened and externally rotated.  No breaks in skin.  +2 DP and PT pulses.  Able to wiggle toes.  Reduced ROM 2/2 Pain.  Neurological: She is alert and oriented to person, place, and time. No cranial nerve deficit.  Skin: Skin is warm.    ED Course  Procedures (including critical care time)  Labs Reviewed  DIFFERENTIAL - Abnormal; Notable for the following:    Eosinophils Relative 6 (*)    All other components within normal limits  BASIC METABOLIC PANEL - Abnormal; Notable for the following:    Glucose, Bld 118 (*)    GFR calc non Af Amer 50 (*)    GFR calc Af Amer 58 (*)    All other components within normal limits  CBC  PROTIME-INR   Dg Chest 2 View  06/01/2011  *RADIOLOGY REPORT*  Clinical Data: Preop for right hip fixation.  Nonsmoker.  No chest complaints.  CHEST - 2 VIEW  Comparison: 04/19/2011  Findings: Mid thoracic compression deformity is again identified and unchanged.  No canal compromise. Lateral view  degraded by patient arm position.  Mild osteopenia. Midline trachea.  Moderate cardiomegaly.  Tortuous thoracic aorta. No pleural effusion or pneumothorax.  Question right upper lobe reticular nodular opacity.  Apparent laterally on the frontal view.  Similar to on the prior exam but not readily apparent on 02/14/2010.  IMPRESSION:  1. Cardiomegaly without congestive failure. 2.  Chronic moderate mid thoracic compression deformity. 3.  Subtle/equivocal lateral right upper lobe reticulonodular opacity.  Favored to represent an area of scarring.  Plain film follow-up at 6 months versus further characterization with CT should be considered.  Original Report Authenticated By: Consuello Bossier, M.D.   Dg Hip Complete Right  06/01/2011  *RADIOLOGY REPORT*  Clinical Data: Trauma and pain.  RIGHT HIP - COMPLETE 2+ VIEW  Comparison: 02/14/2010  Findings: Both femoral heads are located.  Surgical clips in the right hemi pelvis. Sacroiliac joints are symmetric.  Mild osteopenia.  Dedicated views of the right hip demonstrate a comminuted intertrochanteric femur fracture.  Mild varus angulation.  IMPRESSION: Comminuted intertrochanteric right femur fracture. Per CMS PQRS reporting requirements (PQRS Measure 24): Given the patient's age of greater than 50 and the fracture site (hip, distal radius, or spine), the patient should be tested for osteoporosis using DXA, and the appropriate treatment considered based on the DXA results.  Original Report Authenticated By: Consuello Bossier, M.D.     1. Hip fracture       MDM  Mechanical fall with hip pain and deformity.  Neurologically intact.   Xrays, pain control, preop labs.   Date: 06/01/2011  Rate: 63  Rhythm: normal sinus rhythm  QRS Axis: normal  Intervals: QT prolonged  ST/T Wave abnormalities: nonspecific ST/T changes  Conduction Disutrbances:none  Narrative Interpretation:   Old EKG Reviewed: unchanged  Right hip fracture noted.  Patient remains  neurovascularly intact.  D/w Dr. Ophelia Charter who agrees to admit patient.   Will place Buck's traction, NPO, pain control      Glynn Octave, MD 06/01/11 1622

## 2011-06-01 NOTE — Anesthesia Procedure Notes (Signed)
Procedure Name: Intubation Date/Time: 06/01/2011 6:06 PM Performed by: Randon Goldsmith CATHERINE PAYNE Pre-anesthesia Checklist: Patient identified, Emergency Drugs available, Suction available and Patient being monitored Patient Re-evaluated:Patient Re-evaluated prior to inductionOxygen Delivery Method: Circle System Utilized Preoxygenation: Pre-oxygenation with 100% oxygen Intubation Type: IV induction Ventilation: Mask ventilation without difficulty Grade View: Grade I Tube type: Oral Tube size: 7.5 mm Number of attempts: 3 Airway Equipment and Method: video-laryngoscopy and stylet Placement Confirmation: ETT inserted through vocal cords under direct vision,  positive ETCO2,  CO2 detector and breath sounds checked- equal and bilateral Secured at: 22 cm Tube secured with: Tape Dental Injury: Teeth and Oropharynx as per pre-operative assessment  Difficulty Due To: Difficulty was unanticipated Comments: DVL with Mac 3 x1  By CRNA, no view, Pt bagged on 100% FiO2, Sats 100%, DVL with Mac 4 by MDA, No view, immediately DVL by MDA with glidescope, grade 1 view, AOI, lips and teeth as preop.

## 2011-06-01 NOTE — H&P (Signed)
Bonnie Riley is an 77 y.o. female.   Chief Complaint: fall with right hip intertroch Fx comminuted HPI: 77yo with dementia , SNF AMS, depression low thyroid, gastritis and hx UTI's with above fall and hip Fx on right. Daughter here POA for consent.   Past Medical History  Diagnosis Date  . Dementia   . Altered mental status   . Depression   . Hypothyroidism   . Gastritis   . Arthritis   . Migraines   . History of recurrent UTIs   . Alteration in bowel elimination: incontinence   . Urinary bladder incontinence   . Dementia   . Hematoma     Hx of right shin hematoma    Past Surgical History  Procedure Date  . Tonsillectomy     History reviewed. No pertinent family history. Social History:  reports that she has never smoked. She has never used smokeless tobacco. She reports that she does not drink alcohol or use illicit drugs.  Allergies: No Known Allergies  Medications Prior to Admission  Medication Dose Route Frequency Provider Last Rate Last Dose  . 0.9 %  sodium chloride infusion   Intravenous Once Glynn Octave, MD      . 0.9 %  sodium chloride infusion   Intravenous Continuous Wende Neighbors, PA      . acetaminophen (OFIRMEV) IVPB    PRN Lattie Haw   1,000 mg at 06/01/11 1728  . ceFAZolin (ANCEF) IVPB 1 g/50 mL premix  1 g Intravenous 60 min Pre-Op Wende Neighbors, PA      . fentaNYL (SUBLIMAZE) injection 50 mcg  50 mcg Intravenous Once Glynn Octave, MD   50 mcg at 06/01/11 0931  . fentaNYL (SUBLIMAZE) injection    PRN Lattie Haw   50 mcg at 06/01/11 1716  . HYDROmorphone (DILAUDID) 2 MG/ML injection        1 mg at 06/01/11 1258  . HYDROmorphone (DILAUDID) injection 1 mg  1 mg Intravenous Once Glynn Octave, MD      . lactated ringers infusion    Continuous PRN Lattie Haw      . methocarbamol (ROBAXIN) tablet 500 mg  500 mg Oral Q6H PRN Wende Neighbors, PA       Or  . methocarbamol (ROBAXIN) 500 mg in  dextrose 5 % 50 mL IVPB  500 mg Intravenous Q6H PRN Wende Neighbors, PA      . morphine 2 MG/ML injection 0.5 mg  0.5 mg Intravenous Q2H PRN Wende Neighbors, PA      . ondansetron [facility]) injection 4 mg  4 mg Intravenous Once Glynn Octave, MD   4 mg at 06/01/11 0929  . ondansetron [facility]) injection    PRN Lattie Haw   4 mg at 06/01/11 1716   No current outpatient prescriptions on file as of 06/01/2011.    Results for orders placed during the hospital encounter of 06/01/11 (from the past 48 hour(s))  CBC     Status: Normal   Collection Time   06/01/11  9:10 AM      Component Value Range Comment   WBC 8.8  4.0 - 10.5 (K/uL)    RBC 4.44  3.87 - 5.11 (MIL/uL)    Hemoglobin 13.6  12.0 - 15.0 (g/dL)    HCT 04.5  40.9 - 81.1 (%)    MCV 93.9  78.0 - 100.0 (fL)    MCH 30.6  26.0 - 34.0 (  pg)    MCHC 32.6  30.0 - 36.0 (g/dL)    RDW 16.1  09.6 - 04.5 (%)    Platelets 223  150 - 400 (K/uL)   DIFFERENTIAL     Status: Abnormal   Collection Time   06/01/11  9:10 AM      Component Value Range Comment   Neutrophils Relative 64  43 - 77 (%)    Neutro Abs 5.6  1.7 - 7.7 (K/uL)    Lymphocytes Relative 21  12 - 46 (%)    Lymphs Abs 1.8  0.7 - 4.0 (K/uL)    Monocytes Relative 9  3 - 12 (%)    Monocytes Absolute 0.8  0.1 - 1.0 (K/uL)    Eosinophils Relative 6 (*) 0 - 5 (%)    Eosinophils Absolute 0.5  0.0 - 0.7 (K/uL)    Basophils Relative 1  0 - 1 (%)    Basophils Absolute 0.0  0.0 - 0.1 (K/uL)   BASIC METABOLIC PANEL     Status: Abnormal   Collection Time   06/01/11  9:10 AM      Component Value Range Comment   Sodium 140  135 - 145 (mEq/L)    Potassium 3.8  3.5 - 5.1 (mEq/L)    Chloride 102  96 - 112 (mEq/L)    CO2 27  19 - 32 (mEq/L)    Glucose, Bld 118 (*) 70 - 99 (mg/dL)    BUN 21  6 - 23 (mg/dL)    Creatinine, Ser 4.09  0.50 - 1.10 (mg/dL)    Calcium 8.9  8.4 - 10.5 (mg/dL)    GFR calc non Af Amer 50 (*) >90 (mL/min)    GFR calc Af Amer 58 (*) >90 (mL/min)     PROTIME-INR     Status: Normal   Collection Time   06/01/11  9:10 AM      Component Value Range Comment   Prothrombin Time 13.1  11.6 - 15.2 (seconds)    INR 0.97  0.00 - 1.49     Dg Chest 2 View  06/01/2011  *RADIOLOGY REPORT*  Clinical Data: Preop for right hip fixation.  Nonsmoker.  No chest complaints.  CHEST - 2 VIEW  Comparison: 04/19/2011  Findings: Mid thoracic compression deformity is again identified and unchanged.  No canal compromise. Lateral view degraded by patient arm position.  Mild osteopenia. Midline trachea.  Moderate cardiomegaly.  Tortuous thoracic aorta. No pleural effusion or pneumothorax.  Question right upper lobe reticular nodular opacity.  Apparent laterally on the frontal view.  Similar to on the prior exam but not readily apparent on 02/14/2010.  IMPRESSION:  1. Cardiomegaly without congestive failure. 2.  Chronic moderate mid thoracic compression deformity. 3.  Subtle/equivocal lateral right upper lobe reticulonodular opacity.  Favored to represent an area of scarring.  Plain film follow-up at 6 months versus further characterization with CT should be considered.  Original Report Authenticated By: Consuello Bossier, M.D.   Dg Hip Complete Right  06/01/2011  *RADIOLOGY REPORT*  Clinical Data: Trauma and pain.  RIGHT HIP - COMPLETE 2+ VIEW  Comparison: 02/14/2010  Findings: Both femoral heads are located.  Surgical clips in the right hemi pelvis. Sacroiliac joints are symmetric.  Mild osteopenia.  Dedicated views of the right hip demonstrate a comminuted intertrochanteric femur fracture.  Mild varus angulation.  IMPRESSION: Comminuted intertrochanteric right femur fracture. Per CMS PQRS reporting requirements (PQRS Measure 24): Given the patient's age of greater than 72 and  the fracture site (hip, distal radius, or spine), the patient should be tested for osteoporosis using DXA, and the appropriate treatment considered based on the DXA results.  Original Report Authenticated  By: Consuello Bossier, M.D.    Review of Systems  Constitutional: Negative.   Cardiovascular: Negative.   Gastrointestinal: Positive for heartburn.  Genitourinary: Negative.   Musculoskeletal: Positive for joint pain.  Skin: Negative.   Neurological: Positive for headaches.  Endo/Heme/Allergies: Negative.   Psychiatric/Behavioral: Positive for depression and memory loss.    Blood pressure 115/75, pulse 59, temperature 97.9 F (36.6 C), temperature source Oral, resp. rate 20, weight 70.308 kg (155 lb), SpO2 93.00%. Physical Exam  Constitutional: She appears well-developed.  HENT:  Head: Normocephalic.  Eyes: Pupils are equal, round, and reactive to light.  Neck: Normal range of motion.  Cardiovascular: Normal rate, regular rhythm and normal heart sounds.   Respiratory: Effort normal. She has wheezes.  GI: Soft. She exhibits no distension. There is no tenderness.  Musculoskeletal:       Right hip: She exhibits tenderness, crepitus and deformity.  Neurological: No cranial nerve deficit or sensory deficit.     Assessment/Plan Right IT/ST hip Fx for IHS placement , risks discussed, all ?'s answered, she and daughter agree to proceed. Risks of nonunion, screw cut out reoperation discussed.   Elliet Goodnow C 06/01/2011, 5:36 PM

## 2011-06-01 NOTE — ED Notes (Signed)
Was given a total of 100 mcg of Fentanyl by EMS

## 2011-06-01 NOTE — ED Notes (Signed)
MD at bedside. 

## 2011-06-01 NOTE — Op Note (Signed)
Dictated

## 2011-06-01 NOTE — ED Notes (Signed)
ZOX:[id number]<AV> Expected date:06/01/11<BR> Expected time: 8:21 AM<BR> Means of arrival:Ambulance<BR> Comments:<BR> fall

## 2011-06-01 NOTE — Transfer of Care (Signed)
Immediate Anesthesia Transfer of Care Note  Patient: Bonnie Riley  Procedure(s) Performed:  INTRAMEDULLARY (IM) NAIL FEMORAL  Patient Location: PACU  Anesthesia Type: General  Level of Consciousness: awake, alert , patient cooperative and responds to stimulation  Airway & Oxygen Therapy: Patient Spontanous Breathing and Patient connected to face mask oxygen  Post-op Assessment: Report given to PACU RN, Post -op Vital signs reviewed and stable and Patient moving all extremities X 4  Post vital signs: Reviewed and stable  Complications: No apparent anesthesia complications

## 2011-06-02 ENCOUNTER — Encounter (HOSPITAL_COMMUNITY): Payer: Self-pay | Admitting: Orthopaedic Surgery

## 2011-06-02 LAB — BASIC METABOLIC PANEL
CO2: 27 mEq/L (ref 19–32)
Calcium: 8.3 mg/dL — ABNORMAL LOW (ref 8.4–10.5)
Glucose, Bld: 141 mg/dL — ABNORMAL HIGH (ref 70–99)
Potassium: 4.1 mEq/L (ref 3.5–5.1)
Sodium: 141 mEq/L (ref 135–145)

## 2011-06-02 LAB — URINALYSIS, ROUTINE W REFLEX MICROSCOPIC
Glucose, UA: NEGATIVE mg/dL
Hgb urine dipstick: NEGATIVE
Leukocytes, UA: NEGATIVE
Protein, ur: NEGATIVE mg/dL
Specific Gravity, Urine: 1.028 (ref 1.005–1.030)
Urobilinogen, UA: 0.2 mg/dL (ref 0.0–1.0)

## 2011-06-02 LAB — CBC
Hemoglobin: 10.8 g/dL — ABNORMAL LOW (ref 12.0–15.0)
MCH: 30.9 pg (ref 26.0–34.0)
Platelets: 195 10*3/uL (ref 150–400)
RBC: 3.5 MIL/uL — ABNORMAL LOW (ref 3.87–5.11)
WBC: 11.3 10*3/uL — ABNORMAL HIGH (ref 4.0–10.5)

## 2011-06-02 LAB — PROTIME-INR: Prothrombin Time: 14.2 seconds (ref 11.6–15.2)

## 2011-06-02 MED ORDER — SODIUM CHLORIDE 0.9 % IV SOLN
INTRAVENOUS | Status: DC
  Administered 2011-06-02 (×2): via INTRAVENOUS

## 2011-06-02 MED ORDER — LACTATED RINGERS IV BOLUS (SEPSIS)
400.0000 mL | Freq: Once | INTRAVENOUS | Status: AC
  Administered 2011-06-02: 500 mL via INTRAVENOUS

## 2011-06-02 MED ORDER — WARFARIN SODIUM 5 MG PO TABS
5.0000 mg | ORAL_TABLET | Freq: Once | ORAL | Status: AC
  Administered 2011-06-02: 5 mg via ORAL
  Filled 2011-06-02: qty 1

## 2011-06-02 NOTE — Progress Notes (Signed)
ANTICOAGULATION CONSULT NOTE - Follow Up Consult  Pharmacy Consult for Coumadin Indication: VTE prophylaxis   No Known Allergies  Patient Measurements: Height: 5\' 2"  (157.5 cm) Weight: 155 lb (70.308 kg) IBW/kg (Calculated) : 50.1   Vital Signs: Temp: 97.4 F (36.3 C) (11/21 0500) Temp src: Oral (11/21 0500) BP: 98/65 mmHg (11/21 0500) Pulse Rate: 104  (11/21 0500)  Labs:  Basename 06/02/11 0350 06/01/11 0910  HGB 10.8* 13.6  HCT 33.5* 41.7  PLT 195 223  APTT -- --  LABPROT 14.2 13.1  INR 1.08 0.97  HEPARINUNFRC -- --  CREATININE 1.14* 1.01  CKTOTAL -- --  CKMB -- --  TROPONINI -- --   Estimated Creatinine Clearance: 34.4 ml/min (by C-G formula based on Cr of 1.14).   Medications:  Scheduled:    . sodium chloride   Intravenous Once  . ARIPiprazole  5 mg Oral Daily  . aspirin  81 mg Oral Daily  . calcium-vitamin D  1 tablet Oral Daily  . ceFAZolin (ANCEF) IV  1 g Intravenous 60 min Pre-Op  . ceFAZolin (ANCEF) IV  1 g Intravenous Q6H  . divalproex  250 mg Oral Daily  . divalproex  500 mg Oral QHS  . docusate sodium  100 mg Oral BID  . donepezil  10 mg Oral QHS  . enoxaparin  40 mg Subcutaneous Q24H  . fentaNYL  50 mcg Intravenous Once  . hydrocortisone  1 application Rectal UD  . HYDROmorphone      .  HYDROmorphone (DILAUDID) injection  1 mg Intravenous Once  . lactated ringers  400 mL Intravenous Once  . levothyroxine  150 mcg Oral QAC breakfast  . LORazepam  0.5 mg Oral BID  . memantine  10 mg Oral BID  . mirtazapine  15 mg Oral QHS  . ondansetron (ZOFRAN) IV  4 mg Intravenous Once  . patient's guide to using coumadin book   Does not apply Once  . sertraline  100 mg Oral QHS  . sucralfate  1 g Oral BID  . vitamin B-12  1,000 mcg Oral BID  . warfarin  5 mg Oral Once  . warfarin   Does not apply Once   Infusions:    . sodium chloride 100 mL/hr at 06/02/11 0520  . DISCONTD: sodium chloride    . DISCONTD: lactated ringers      Assessment:  54  YOF with R hip fracture s/p intramedullary nailing started coumadin 11/20 for VTE prophylaxis.  INR 1.14 this AM   Hgb down to 10.8 s/p surgery, no bleeding/complications from coumadin reported  Goal of Therapy:  INR 2-3   Plan:   Continue coumadin at 5 mg po x 1 tonight  F/u daily PT/INR  Geoffry Paradise Thi 06/02/2011,8:04 AM

## 2011-06-02 NOTE — Progress Notes (Signed)
Subjective: 1 Day Post-Op Procedure(s) (LRB): INTRAMEDULLARY (IM) NAIL FEMORAL (Right) Patient reports pain as mild.   Lives at [facility] facility  Not sure if there is skilled assistance there.  Will need assist with case manager for SNF prior to returning to permanent residence at [facility] Was using walker prior to her admission Objective: Vital signs in last 24 hours: Temp:  [97.1 F (36.2 C)-98.2 F (36.8 C)] 97.4 F (36.3 C) (11/21 0500) Pulse Rate:  [59-104] 104  (11/21 0500) Resp:  [10-22] 12  (11/21 0500) BP: (81-156)/(51-82) 98/65 mmHg (11/21 0500) SpO2:  [93 %-100 %] 100 % (11/21 0500) Weight:  [70 kg (154 lb 5.2 oz)-70.308 kg (155 lb)] 155 lb (70.308 kg) (11/20 2045)  Intake/Output from previous day: 11/20 0701 - 11/21 0700 In: 2670 [P.O.:120; I.V.:2050; IV Piggyback:500] Out: 775 [Urine:525; Blood:250] Intake/Output this shift:     Basename 06/02/11 0350 06/01/11 0910  HGB 10.8* 13.6    Basename 06/02/11 0350 06/01/11 0910  WBC 11.3* 8.8  RBC 3.50* 4.44  HCT 33.5* 41.7  PLT 195 223    Basename 06/02/11 0350 06/01/11 0910  NA 141 140  K 4.1 3.8  CL 104 102  CO2 27 27  BUN 20 21  CREATININE 1.14* 1.01  GLUCOSE 141* 118*  CALCIUM 8.3* 8.9    Basename 06/02/11 0350 06/01/11 0910  LABPT -- --  INR 1.08 0.97    Neurovascular intact Intact pulses distally Dorsiflexion/Plantar flexion intact Incision: no drainage  Assessment/Plan: 1 Day Post-Op Procedure(s) (LRB): INTRAMEDULLARY (IM) NAIL FEMORAL (Right) Up with therapy D/C IV fluids Discharge to SNF when stable.   D/C foley  Pt reports she is incontinent to urine and uses depends. Will get UA if we have not already done one.  Britlee Skolnik M 06/02/2011, 8:46 AM

## 2011-06-02 NOTE — Progress Notes (Signed)
CSW completed psychosocial assessment (pls see shadow chart). Pts was pleasantly confused but able to answer some questions. Pt has been a resident at [facility] for the past few months.  CSW spoke with the pts daughter Kriste Basque over the phone and she reports pt has been in several facilities in the past and she is hoping this will be the last transition. CSW will complete an FL-2 and place in the chart. CSW will continue to follow and offer support.  Patrice Paradise, LCSWA 06/02/2011 12:09 PM [phone]

## 2011-06-02 NOTE — Op Note (Signed)
Bonnie Riley, ARAVE NO.:  [id number]  MEDICAL RECORD NO.:  [id number]  LOCATION:  1602                         FACILITY:  [facility]  PHYSICIAN:  Miamarie Moll C. Ophelia Charter, M.D.    DATE OF BIRTH:  12-14-1925  DATE OF PROCEDURE:  06/01/2011 DATE OF DISCHARGE:                              OPERATIVE REPORT   PREOPERATIVE DIAGNOSIS:  Right hip intramedullary hip screw, [employer] (formerly DePuy) 9 x 360 nail, 100 mm lag screw, distal interlock 44 mm.  POSTOPERATIVE DIAGNOSIS:  Right hip intramedullary hip screw, [employer] (formerly DePuy) 9 x 360 nail, 100 mm lag screw, distal interlock 44 mm.  ANESTHESIA:  GOT.  ESTIMATED BLOOD LOSS:  200 cc.  DRAINS:  None.  DRAINS:  None.  This is an 77 year old female, who fell, she is at nursing home, is ambulatory without a walker, suffered right comminuted intertrochanteric, subtrochanteric hip fracture.  PROCEDURE:  After induction of general anesthesia, patient placed on the fracture table.  Preoperative Ancef, prophylaxis, reduction was performed with traction well.  Leg holder was used the opposite leg. Foley catheter was already placed.  Internal rotation and traction and reduction maneuver was performed when there was satisfactory alignment, prepping with DuraPrep.  A time-out procedure was completed when the prep was drying.  Area was squared with towels.  Betadine shower curtain and Steri-Drape applied.  Instrument count, needle count was correct at the beginning of the case and lateral incision was made starting above the trochanter, gluteus medius was split.  Hemostasis obtained with the Bovie.  K-wire was drilled checking under fluoroscopy AP and lateral, over reaming and then passing the 9 x 360 mm nail based on ball-tip rod, which was advanced down the canal down to the knee confirmed with AP and lateral fluoro.  A 9 x 360 nail was placed, pushed down until it was in satisfactory position.  Lag screw, lateral guide was used.   A small incision was made in the skin drilling with K-wire, centered and head reaming.  Flexion of the 110 lag screw and then insertion of the screw locking down tight, quarter turn, back turned.  Distal interlock was placed in freehand technique, 44 mm through the round more proximal screw hole in the supracondylar region of the femur.  Spot picture was taken.  Wound was irrigated in standard layered closure.  There was one intramedullary pulsatile bleeder in the mid thigh from the lag screw hole, which was coagulated.  Layered closure with 0 Vicryl in deep fascia, 2-0 Vicryl in subcutaneous tissue, skin staple closure.  Postop dressing, 4x4s, and tape.  The patient tolerated the procedure well.     Jaedynn Bohlken C. Ophelia Charter, M.D.     MCY/MEDQ  D:  06/01/2011  T:  06/02/2011  Job:  [id number]

## 2011-06-02 NOTE — Progress Notes (Signed)
Physical Therapy Treatment Patient Details Name: Neeley Sedivy MRN: [id number] DOB: Oct 17, 1925 Today's Date: 06/02/2011 [id number] 1Ta  PT Assessment/Plan  PT - Assessment/Plan Comments on Treatment Session: Patient required skilled PT to assist back to bed due to NWB status and increased muscle tone throughtout and associated dementia. PT Plan: Discharge plan remains appropriate PT Frequency: Min 3X/week Follow Up Recommendations: Skilled nursing facility Equipment Recommended: Defer to next venue PT Goals  Acute Rehab PT Goals PT Goal Formulation: With patient/family Time For Goal Achievement: 2 weeks Pt will perform sit to supine: with mod assist Progress: Progressing towards goal Pt will Transfer Bed to Chair/Chair to Bed: with mod assist PT Transfer Goal: Bed to Chair/Chair to Bed - Progress: Progressing toward goal  PT Treatment Precautions/Restrictions  Restrictions Weight Bearing Restrictions: Yes RLE Weight Bearing: Non weight bearing Mobility (including Balance) Bed Mobility Bed Mobility: Yes Sit to Supine - Left Details (indicate cue type and reason): pt<10% Transfers Transfers: Yes Squat Pivot Transfers: 1: +2 Total assist Squat Pivot Transfer Details (indicate cue type and reason): pt=30% recliner to bed on left with assist to maintain NWB on right LE.    End of Session PT - End of Session Equipment Utilized During Treatment: Gait belt Activity Tolerance: Patient limited by pain Patient left: in bed;with call bell in reach;with family/visitor present General Behavior During Session: [facility] for tasks performed Cognition: Impaired, at baseline  [facility] 06/02/2011, 5:12 PM

## 2011-06-02 NOTE — Progress Notes (Signed)
Physical Therapy Evaluation Patient Details Name: Bonnie Riley MRN: [id number] DOB: Apr 16, 1926 Today's Date: 06/02/2011 [id number] Ev2  Problem List:  Patient Active Problem List  Diagnoses  . Hip fracture, right    Past Medical History:  Past Medical History  Diagnosis Date  . Dementia   . Altered mental status   . Depression   . Hypothyroidism   . Gastritis   . Arthritis   . Migraines   . History of recurrent UTIs   . Alteration in bowel elimination: incontinence   . Urinary bladder incontinence   . Dementia   . Hematoma     Hx of right shin hematoma   Past Surgical History:  Past Surgical History  Procedure Date  . Tonsillectomy   . Femur im nail 06/01/2011    Procedure: INTRAMEDULLARY (IM) NAIL FEMORAL;  Surgeon: Eldred Manges;  Location: [facility]S;  Service: Orthopedics;  Laterality: Right;    PT Assessment/Plan/Recommendation PT Assessment Clinical Impression Statement: Patient s/p right hip fracture and IMHS presents with decreased mobility with acute pain, limited strength, ROM and decreased knowledge of precautions with NWB right LE.  Will benefit from skilled PT in the acute care setting to maximize independence and facilitate transfer to rehab at local SNF. PT Recommendation/Assessment: Patient will need skilled PT in the acute care venue PT Problem List: Decreased range of motion;Decreased strength;Decreased knowledge of use of DME;Pain;Decreased knowledge of precautions;Decreased mobility PT Therapy Diagnosis : Difficulty walking;Acute pain PT Plan PT Frequency: Min 3X/week PT Treatment/Interventions: Gait training;DME instruction;Patient/family education;Functional mobility training;Therapeutic exercise PT Recommendation Follow Up Recommendations: Skilled nursing facility Equipment Recommended: Defer to next venue PT Goals  Acute Rehab PT Goals PT Goal Formulation: With patient/family Time For Goal Achievement: 2 weeks Pt will go Supine/Side to Sit:  with mod assist PT Goal: Supine/Side to Sit - Progress: Progressing toward goal Pt will go Sit to Supine/Side: with mod assist PT Goal: Sit to Supine/Side - Progress: Other (comment) (not addressed on eval) Pt will Transfer Sit to Stand/Stand to Sit: with mod assist PT Transfer Goal: Sit to Stand/Stand to Sit - Progress: Progressing toward goal Pt will Transfer Bed to Chair/Chair to Bed: with mod assist PT Transfer Goal: Bed to Chair/Chair to Bed - Progress: Progressing toward goal Pt will Stand: with min assist;1 - 2 min (and maintain NWB right in prep for gait) PT Goal: Stand - Progress: Progressing toward goal  PT Evaluation Precautions/Restrictions  Restrictions Weight Bearing Restrictions: Yes RLE Weight Bearing: Non weight bearing Prior Functioning  Home Living Lives With: Alone Type of Home: Assisted living Home Adaptive Equipment: Walker - rolling Prior Function Level of Independence: Independent with gait;Independent with transfers;Independent with basic ADLs (shuffling gait per daughter and was told to use walker) Cognition Cognition Arousal/Alertness: Awake/alert Overall Cognitive Status: Appears within functional limits for tasks assessed Orientation Level: Oriented to situation;Oriented to person Sensation/Coordination   Extremity Assessment RLE Assessment RLE Assessment: Exceptions to [facility] RLE AROM (degrees) Overall AROM Right Lower Extremity: Deficits RLE Overall AROM Comments: painful AAROM up to about 20 degrees knee and hip while supine in bed.  Complained of increased pain with leg bent sitting at EOB RLE Strength RLE Overall Strength: Deficits RLE Overall Strength Comments: positive quad set otherwise not tested due to pain LLE Assessment LLE Assessment: Exceptions to [facility] LLE AROM (degrees) Overall AROM Left Lower Extremity: Deficits LLE Overall AROM Comments: stiffness noted with AAROM initially, but with exercise improved to [facility] LLE Strength LLE  Overall Strength: Deficits LLE  Overall Strength Comments: grossly 3+-4/5 Mobility (including Balance) Bed Mobility Bed Mobility: Yes Supine to Sit: 1: +2 Total assist Supine to Sit Details (indicate cue type and reason): pt=20% with max cues and manual assist for technique Sitting - Scoot to Edge of Bed: 2: Max assist Sitting - Scoot to [employer] of Bed Details (indicate cue type and reason): scooting on pad under patient Transfers Transfers: Yes Sit to Stand: 1: +2 Total assist;From bed;With upper extremity assist Sit to Stand Details (indicate cue type and reason): pt=20-30% with max cues for NWB Rt. and for technique Stand to Sit: 1: +2 Total assist;To chair/3-in-1;With upper extremity assist Stand to Sit Details: pt=15% with max cues and assist for safety  Stand Pivot Transfers: 1: +2 Total assist Stand Pivot Transfer Details (indicate cue type and reason): with rolling walker and max cues assist for NWB right pt=20%.    Exercise  General Exercises - Lower Extremity Ankle Circles/Pumps: AROM;Both;10 reps;Supine Quad Sets: AROM;Both;5 reps;Supine Heel Slides: AAROM;Both;5 reps;Supine End of Session PT - End of Session Equipment Utilized During Treatment: Gait belt Activity Tolerance: Patient limited by pain Patient left: in chair;with family/visitor present;with call bell in reach General Behavior During Session: [facility] for tasks performed Cognition: Impaired, at baseline  [facility] 06/02/2011, 5:07 PM

## 2011-06-03 LAB — BASIC METABOLIC PANEL
Calcium: 8.1 mg/dL — ABNORMAL LOW (ref 8.4–10.5)
GFR calc Af Amer: 66 mL/min — ABNORMAL LOW (ref >90)
GFR calc non Af Amer: 57 mL/min — ABNORMAL LOW (ref >90)
Potassium: 3.8 mEq/L (ref 3.5–5.1)
Sodium: 138 mEq/L (ref 135–145)

## 2011-06-03 LAB — CBC
MCH: 30.7 pg (ref 26.0–34.0)
MCHC: 32.2 g/dL (ref 30.0–36.0)
Platelets: 147 10*3/uL — ABNORMAL LOW (ref 150–400)

## 2011-06-03 LAB — PROTIME-INR
INR: 1.53 — ABNORMAL HIGH (ref 0.00–1.49)
Prothrombin Time: 18.7 seconds — ABNORMAL HIGH (ref 11.6–15.2)

## 2011-06-03 MED ORDER — WARFARIN SODIUM 2.5 MG PO TABS
2.5000 mg | ORAL_TABLET | Freq: Once | ORAL | Status: AC
  Administered 2011-06-03: 2.5 mg via ORAL
  Filled 2011-06-03: qty 1

## 2011-06-03 NOTE — Plan of Care (Signed)
Problem: Phase II Progression Outcomes Goal: Discharge plan established Outcome: Completed/Met Date Met:  06/03/11 SNF

## 2011-06-03 NOTE — Progress Notes (Signed)
Subjective: 2 Days Post-Op Procedure(s) (LRB): INTRAMEDULLARY (IM) NAIL FEMORAL (Right) No complaints    Objective: Vital signs in last 24 hours: Temp:  [98.4 F (36.9 C)-98.7 F (37.1 C)] 98.4 F (36.9 C) (11/22 0500) Pulse Rate:  [77-86] 85  (11/22 0500) Resp:  [12-16] 16  (11/22 0500) BP: (103-119)/(66-75) 119/75 mmHg (11/22 0500) SpO2:  [97 %-99 %] 97 % (11/22 0500)  Intake/Output from previous day: 11/21 0701 - 11/22 0700 In: 2108.3 [P.O.:900; I.V.:1208.3] Out: 1000 [Urine:1000] Intake/Output this shift:     Basename 06/03/11 0402 06/02/11 0350 06/01/11 0910  HGB 8.9* 10.8* 13.6    Basename 06/03/11 0402 06/02/11 0350  WBC 9.1 11.3*  RBC 2.90* 3.50*  HCT 27.6* 33.5*  PLT 147* 195    Basename 06/03/11 0402 06/02/11 0350  NA 138 141  K 3.8 4.1  CL 104 104  CO2 29 27  BUN 14 20  CREATININE 0.91 1.14*  GLUCOSE 110* 141*  CALCIUM 8.1* 8.3*    Basename 06/03/11 0402 06/02/11 0350  LABPT -- --  INR 1.53* 1.08    Neurologically intact  Assessment/Plan: 2 Days Post-Op Procedure(s) (LRB): INTRAMEDULLARY (IM) NAIL FEMORAL (Right) Discharge to SNF D/C IV  Niko Jakel V 06/03/2011, 10:11 AM

## 2011-06-03 NOTE — Progress Notes (Signed)
ANTICOAGULATION CONSULT NOTE - Follow Up Consult  Pharmacy Consult for Coumadin Indication: VTE prophylaxis   No Known Allergies  Patient Measurements: Height: 5\' 2"  (157.5 cm) Weight: 155 lb (70.308 kg) IBW/kg (Calculated) : 50.1   Vital Signs: Temp: 98.4 F (36.9 C) (11/22 0500) Temp src: Oral (11/22 0500) BP: 119/75 mmHg (11/22 0500) Pulse Rate: 85  (11/22 0500)  Labs:  Basename 06/03/11 0402 06/02/11 0350 06/01/11 0910  HGB 8.9* 10.8* --  HCT 27.6* 33.5* 41.7  PLT 147* 195 223  APTT -- -- --  LABPROT 18.7* 14.2 13.1  INR 1.53* 1.08 0.97  HEPARINUNFRC -- -- --  CREATININE 0.91 1.14* 1.01  CKTOTAL -- -- --  CKMB -- -- --  TROPONINI -- -- --   Estimated Creatinine Clearance: 43 ml/min (by C-G formula based on Cr of 0.91).   Medications:  Scheduled:     . ARIPiprazole  5 mg Oral Daily  . aspirin  81 mg Oral Daily  . calcium-vitamin D  1 tablet Oral Daily  . divalproex  250 mg Oral Daily  . divalproex  500 mg Oral QHS  . docusate sodium  100 mg Oral BID  . donepezil  10 mg Oral QHS  . enoxaparin  40 mg Subcutaneous Q24H  . hydrocortisone  1 application Rectal UD  .  HYDROmorphone (DILAUDID) injection  1 mg Intravenous Once  . levothyroxine  150 mcg Oral QAC breakfast  . LORazepam  0.5 mg Oral BID  . memantine  10 mg Oral BID  . mirtazapine  15 mg Oral QHS  . sertraline  100 mg Oral QHS  . sucralfate  1 g Oral BID  . vitamin B-12  1,000 mcg Oral BID  . warfarin  5 mg Oral ONCE-1800   Infusions:     . DISCONTD: sodium chloride 100 mL/hr at 06/02/11 1617    Assessment:  85 YOF with R hip fracture s/p intramedullary nailing started coumadin 11/20 for VTE prophylaxis.  INR responding significantly now - 1.53 this AM   Hgb down to 8.9 from 10.8 s/p surgery, no bleeding/complications from coumadin reported  Goal of Therapy:  INR 2-3   Plan:   Decrease coumadin to 2.5 mg po x 1 tonight  Continue Lovenox 40mg  SQ q24h until INR therapeutic  F/u  daily PT/INR  Loralee Pacas R 06/03/2011,12:14 PM

## 2011-06-03 NOTE — Progress Notes (Signed)
OT order received as well as D/C OT order. Noted from Dr. Audrie Lia note that pt can D/C to SNF. Defer OT eval to SNF as they deem appropriate. D/C from acute OT.

## 2011-06-04 LAB — BASIC METABOLIC PANEL
BUN: 9 mg/dL (ref 6–23)
Chloride: 105 mEq/L (ref 96–112)
GFR calc Af Amer: 90 mL/min — ABNORMAL LOW (ref >90)
GFR calc non Af Amer: 77 mL/min — ABNORMAL LOW (ref >90)
Potassium: 3.5 mEq/L (ref 3.5–5.1)
Sodium: 143 mEq/L (ref 135–145)

## 2011-06-04 LAB — CBC
MCHC: 32.4 g/dL (ref 30.0–36.0)
Platelets: 168 10*3/uL (ref 150–400)
RDW: 15.7 % — ABNORMAL HIGH (ref 11.5–15.5)
WBC: 8.2 10*3/uL (ref 4.0–10.5)

## 2011-06-04 LAB — PROTIME-INR
INR: 2.24 — ABNORMAL HIGH (ref 0.00–1.49)
Prothrombin Time: 25.2 seconds — ABNORMAL HIGH (ref 11.6–15.2)

## 2011-06-04 MED ORDER — WARFARIN 0.5 MG HALF TABLET
0.5000 mg | ORAL_TABLET | Freq: Once | ORAL | Status: AC
  Administered 2011-06-04: 0.5 mg via ORAL
  Filled 2011-06-04: qty 1

## 2011-06-04 NOTE — Progress Notes (Signed)
ANTICOAGULATION CONSULT NOTE - Follow Up Consult  Pharmacy Consult for Coumadin Indication: VTE prophylaxis   No Known Allergies  Patient Measurements: Height: 5\' 2"  (157.5 cm) Weight: 155 lb (70.308 kg) IBW/kg (Calculated) : 50.1   Vital Signs: Temp: 97.5 F (36.4 C) (11/23 0453) Temp src: Oral (11/23 0453) BP: 152/83 mmHg (11/23 0453) Pulse Rate: 74  (11/23 0453)  Labs:  Basename 06/04/11 0350 06/03/11 0402 06/02/11 0350  HGB 8.8* 8.9* --  HCT 27.2* 27.6* 33.5*  PLT 168 147* 195  APTT -- -- --  LABPROT 25.2* 18.7* 14.2  INR 2.24* 1.53* 1.08  HEPARINUNFRC -- -- --  CREATININE 0.72 0.91 1.14*  CKTOTAL -- -- --  CKMB -- -- --  TROPONINI -- -- --   Estimated Creatinine Clearance: 49 ml/min (by C-G formula based on Cr of 0.72).   Medications:  Scheduled:     . ARIPiprazole  5 mg Oral Daily  . aspirin  81 mg Oral Daily  . calcium-vitamin D  1 tablet Oral Daily  . divalproex  250 mg Oral Daily  . divalproex  500 mg Oral QHS  . docusate sodium  100 mg Oral BID  . donepezil  10 mg Oral QHS  . enoxaparin  40 mg Subcutaneous Q24H  . hydrocortisone  1 application Rectal UD  .  HYDROmorphone (DILAUDID) injection  1 mg Intravenous Once  . levothyroxine  150 mcg Oral QAC breakfast  . LORazepam  0.5 mg Oral BID  . memantine  10 mg Oral BID  . mirtazapine  15 mg Oral QHS  . sertraline  100 mg Oral QHS  . sucralfate  1 g Oral BID  . vitamin B-12  1,000 mcg Oral BID  . warfarin  2.5 mg Oral ONCE-1800   Infusions:     Assessment:  29 YOF with R hip fracture s/p intramedullary nailing started coumadin 11/20 for VTE prophylaxis.  INR responding significantly now - 1.53 this AM   Hgb down to 8.8 from 10.8 s/p surgery, no bleeding/complications from coumadin reported  Poor po intake  INR rising quickly, INR is therapeutic this morning.  Goal of Therapy:  INR 2-3   Plan:   Decrease coumadin to 0.5mg  today  D/c Lovenox   F/u daily PT/INR  Elizzie Westergard  L 06/04/2011,10:17 AM

## 2011-06-04 NOTE — Progress Notes (Signed)
Subjective: No acute changes overnight.  Working very slowly with therapy.  Will need SNF placement.   Objective: Vital signs in last 24 hours: Temp:  [97.4 F (36.3 C)-98.3 F (36.8 C)] 97.5 F (36.4 C) (11/23 0453) Pulse Rate:  [74-99] 74  (11/23 0453) Resp:  [16] 16  (11/23 0453) BP: (127-152)/(79-84) 152/83 mmHg (11/23 0453) SpO2:  [95 %-98 %] 95 % (11/23 0453)  Intake/Output from previous day: 11/22 0701 - 11/23 0700 In: 1555 [P.O.:720; I.V.:835] Out: 200 [Urine:200] Intake/Output this shift:     Basename 06/04/11 0350 06/03/11 0402 06/02/11 0350 06/01/11 0910  HGB 8.8* 8.9* 10.8* 13.6    Basename 06/04/11 0350 06/03/11 0402  WBC 8.2 9.1  RBC 2.85* 2.90*  HCT 27.2* 27.6*  PLT 168 147*    Basename 06/04/11 0350 06/03/11 0402  NA 143 138  K 3.5 3.8  CL 105 104  CO2 32 29  BUN 9 14  CREATININE 0.72 0.91  GLUCOSE 93 110*  CALCIUM 8.4 8.1*    Basename 06/04/11 0350 06/03/11 0402  LABPT -- --  INR 2.24* 1.53*    Sensation intact distally Intact pulses distally Dorsiflexion/Plantar flexion intact Incision: scant drainage Compartment soft  Assessment/Plan: Will need D/C to SNF.   Kelli Egolf Y 06/04/2011, 7:03 AM

## 2011-06-05 LAB — CBC
MCV: 94.6 fL (ref 78.0–100.0)
Platelets: 203 10*3/uL (ref 150–400)
RDW: 15.5 % (ref 11.5–15.5)
WBC: 7.4 10*3/uL (ref 4.0–10.5)

## 2011-06-05 LAB — BASIC METABOLIC PANEL
Chloride: 103 mEq/L (ref 96–112)
Creatinine, Ser: 0.8 mg/dL (ref 0.50–1.10)
GFR calc Af Amer: 77 mL/min — ABNORMAL LOW (ref >90)
Sodium: 141 mEq/L (ref 135–145)

## 2011-06-05 LAB — PROTIME-INR
INR: 1.86 — ABNORMAL HIGH (ref 0.00–1.49)
Prothrombin Time: 21.8 seconds — ABNORMAL HIGH (ref 11.6–15.2)

## 2011-06-05 MED ORDER — WARFARIN SODIUM 2 MG PO TABS
2.0000 mg | ORAL_TABLET | Freq: Once | ORAL | Status: AC
  Administered 2011-06-05: 2 mg via ORAL
  Filled 2011-06-05: qty 1

## 2011-06-05 NOTE — Progress Notes (Signed)
ANTICOAGULATION CONSULT NOTE - Follow Up Consult  Pharmacy Consult for Coumadin Indication: VTE prophylaxis   No Known Allergies  Patient Measurements: Height: 5\' 2"  (157.5 cm) Weight: 155 lb (70.308 kg) IBW/kg (Calculated) : 50.1   Vital Signs: Temp: 97.7 F (36.5 C) (11/24 0509) Temp src: Oral (11/24 0509) BP: 126/80 mmHg (11/24 0509) Pulse Rate: 93  (11/24 0509)  Labs:  Basename 06/05/11 0358 06/04/11 0350 06/03/11 0402  HGB 9.0* 8.8* --  HCT 28.0* 27.2* 27.6*  PLT 203 168 147*  APTT -- -- --  LABPROT 21.8* 25.2* 18.7*  INR 1.86* 2.24* 1.53*  HEPARINUNFRC -- -- --  CREATININE 0.80 0.72 0.91  CKTOTAL -- -- --  CKMB -- -- --  TROPONINI -- -- --   Estimated Creatinine Clearance: 49 ml/min (by C-G formula based on Cr of 0.8).   Medications:  Scheduled:     . ARIPiprazole  5 mg Oral Daily  . aspirin  81 mg Oral Daily  . calcium-vitamin D  1 tablet Oral Daily  . divalproex  250 mg Oral Daily  . divalproex  500 mg Oral QHS  . docusate sodium  100 mg Oral BID  . donepezil  10 mg Oral QHS  . hydrocortisone  1 application Rectal UD  .  HYDROmorphone (DILAUDID) injection  1 mg Intravenous Once  . levothyroxine  150 mcg Oral QAC breakfast  . LORazepam  0.5 mg Oral BID  . memantine  10 mg Oral BID  . mirtazapine  15 mg Oral QHS  . sertraline  100 mg Oral QHS  . sucralfate  1 g Oral BID  . vitamin B-12  1,000 mcg Oral BID  . warfarin  0.5 mg Oral ONCE-1800  . warfarin  2 mg Oral ONCE-1800  . DISCONTD: enoxaparin  40 mg Subcutaneous Q24H   Infusions:     Assessment:  46 YOF with R hip fracture s/p intramedullary nailing started coumadin 11/20 for VTE prophylaxis  No bleeding/complications reported  Poor po intake  INR had  rising quickly,  Lovenox d/c yesterday  Goal of Therapy:  INR 2-3   Plan:   Coumadin 2mg  today,  F/u daily PT/INR  Starling Jessie L 06/05/2011,9:43 AM

## 2011-06-05 NOTE — Progress Notes (Signed)
Physical Therapy Treatment Patient Details Name: Bonnie Riley MRN: [id number] DOB: [birth year] Today's Date: 06/05/2011 9:30 - 10:00 2 ta PT Assessment/Plan  PT - Assessment/Plan Comments on Treatment Session: "Do we have to do this now, I want to sleep" PT Plan: Discharge plan remains appropriate PT Frequency: Min 3X/week Follow Up Recommendations: Skilled nursing facility Equipment Recommended: Defer to next venue PT Goals  Acute Rehab PT Goals PT Goal Formulation: With patient Pt will go Supine/Side to Sit: with mod assist PT Goal: Supine/Side to Sit - Progress: Progressing toward goal Pt will go Sit to Supine/Side: with mod assist PT Goal: Sit to Supine/Side - Progress: Progressing toward goal Pt will Transfer Sit to Stand/Stand to Sit: with mod assist PT Transfer Goal: Sit to Stand/Stand to Sit - Progress: Progressing toward goal Pt will Transfer Bed to Chair/Chair to Bed: with mod assist PT Transfer Goal: Bed to Chair/Chair to Bed - Progress: Progressing toward goal Pt will Stand: with min assist (while maintaining R LE NWB) PT Goal: Stand - Progress: Progressing toward goal  PT Treatment Precautions/Restrictions  Restrictions Weight Bearing Restrictions: Yes RLE Weight Bearing: Non weight bearing Mobility (including Balance) Bed Mobility Bed Mobility: Yes Supine to Sit: 1: +2 Total assist Supine to Sit Details (indicate cue type and reason): total assist + 2 pt 5% with 100% VC's to decrease anxiety/fear and pain level Sitting - Scoot to Edge of Bed: 1: +2 Total assist Sitting - Scoot to Edge of Bed Details (indicate cue type and reason): Total assist + 2 pt 5% with 100% VC's to decrease fear/falling OOB Transfers Transfers: Yes Sit to Stand: 1: +2 Total assist;From bed Sit to Stand Details (indicate cue type and reason): Squat pivot to her Left bed to chair Total assist + 2 pt 10% Stand to Sit: 1: +2 Total assist;To chair/3-in-1 Stand to Sit Details: Total assit  + 2 squat pivot pt 10% Squat Pivot Transfers: 1: +2 Total assist Squat Pivot Transfer Details (indicate cue type and reason): Total assist squat pivot to her L bed to chair pt 10% Ambulation/Gait Ambulation/Gait: No Stairs: No Wheelchair Mobility Wheelchair Mobility: No    Exercise    End of Session PT - End of Session Equipment Utilized During Treatment: Gait belt Activity Tolerance: Patient limited by pain Patient left: in chair;with call bell in reach [place] [employer] pad in chair for nursing to trans back to bed) Nurse Communication: Need for lift equipment (pt is a Nurse, adult back to bed) General Behavior During Session:  (increased anxiety, reluctant to get OOB) Cognition:  (AxO X 3  required MAX encouragement to participate)  Felecia Shelling PTA [facility]  Acute  Rehab Pager     [phone]

## 2011-06-05 NOTE — Progress Notes (Signed)
Subjective: 4 Days Post-Op Procedure(s) (LRB): INTRAMEDULLARY (IM) NAIL FEMORAL (Right) No acute changes overnight.  Objective: Vital signs in last 24 hours: Temp:  [97.5 F (36.4 C)-99.1 F (37.3 C)] 97.8 F (36.6 C) (11/24 1333) Pulse Rate:  [69-98] 69  (11/24 1333) Resp:  [16-20] 16  (11/24 1333) BP: (114-128)/(70-81) 128/70 mmHg (11/24 1333) SpO2:  [94 %-100 %] 94 % (11/24 1333)  Intake/Output from previous day: 11/23 0701 - 11/24 0700 In: 360 [P.O.:360] Out: -  Intake/Output this shift: Total I/O In: 240 [P.O.:240] Out: -    Basename 06/05/11 0358 06/04/11 0350 06/03/11 0402  HGB 9.0* 8.8* 8.9*    Basename 06/05/11 0358 06/04/11 0350  WBC 7.4 8.2  RBC 2.96* 2.85*  HCT 28.0* 27.2*  PLT 203 168    Basename 06/05/11 0358 06/04/11 0350  NA 141 143  K 3.7 3.5  CL 103 105  CO2 33* 32  BUN 11 9  CREATININE 0.80 0.72  GLUCOSE 98 93  CALCIUM 8.5 8.4    Basename 06/05/11 0358 06/04/11 0350  LABPT -- --  INR 1.86* 2.24*    Intact pulses distally Dorsiflexion/Plantar flexion intact Incision: scant drainage Compartment soft  Assessment/Plan: 4 Days Post-Op Procedure(s) (LRB): INTRAMEDULLARY (IM) NAIL FEMORAL (Right) Discharge to SNF by early next week.  Dajour Pierpoint Y 06/05/2011, 1:45 PM

## 2011-06-06 LAB — PROTIME-INR
INR: 2 — ABNORMAL HIGH (ref 0.00–1.49)
Prothrombin Time: 23 seconds — ABNORMAL HIGH (ref 11.6–15.2)

## 2011-06-06 MED ORDER — WARFARIN SODIUM 2 MG PO TABS
2.0000 mg | ORAL_TABLET | Freq: Once | ORAL | Status: AC
  Administered 2011-06-06: 2 mg via ORAL
  Filled 2011-06-06: qty 1

## 2011-06-06 NOTE — Progress Notes (Signed)
Clinical Social Worker received call from RN on unit with family in room requesting information regarding dc planning and placement options/bed offers.  CSW reviewed chart, patient had not been faxed out or FL2 completed, thus CSW completed and faxed to [facility].  Will follow up on Monday with bed offers for family and anticipate dc to SNF when bed chosen and medically stable.  fl2 completed, please sign MD. passar completed Faxed out completed.  Ashley Jacobs, MSW LCSWA [phone]

## 2011-06-06 NOTE — Progress Notes (Signed)
Subjective: 5 Days Post-Op Procedure(s) (LRB): INTRAMEDULLARY (IM) NAIL FEMORAL (Right) No acute changes overnight. Very slow mobility.  Objective: Vital signs in last 24 hours: Temp:  [97.8 F (36.6 C)-98.8 F (37.1 C)] 97.9 F (36.6 C) (11/25 0500) Pulse Rate:  [66-75] 75  (11/25 0500) Resp:  [16-20] 20  (11/25 0500) BP: (128-167)/(70-90) 167/90 mmHg (11/25 0500) SpO2:  [94 %-100 %] 99 % (11/25 0500)  Intake/Output from previous day: 11/24 0701 - 11/25 0700 In: 540 [P.O.:540] Out: -  Intake/Output this shift:     Basename 06/05/11 0358 06/04/11 0350  HGB 9.0* 8.8*    Basename 06/05/11 0358 06/04/11 0350  WBC 7.4 8.2  RBC 2.96* 2.85*  HCT 28.0* 27.2*  PLT 203 168    Basename 06/05/11 0358 06/04/11 0350  NA 141 143  K 3.7 3.5  CL 103 105  CO2 33* 32  BUN 11 9  CREATININE 0.80 0.72  GLUCOSE 98 93  CALCIUM 8.5 8.4    Basename 06/06/11 0445 06/05/11 0358  LABPT -- --  INR 2.00* 1.86*    Intact pulses distally Dorsiflexion/Plantar flexion intact Incision: scant drainage No cellulitis present  Assessment/Plan: 5 Days Post-Op Procedure(s) (LRB): INTRAMEDULLARY (IM) NAIL FEMORAL (Right) Discharge to SNF when bed available.  Bonnie Riley Y 06/06/2011, 10:40 AM

## 2011-06-06 NOTE — Progress Notes (Signed)
ANTICOAGULATION CONSULT NOTE - Follow Up Consult  Pharmacy Consult for Coumadin Indication: VTE prophylaxis   No Known Allergies  Patient Measurements: Height: 5\' 2"  (157.5 cm) Weight: 155 lb (70.308 kg) IBW/kg (Calculated) : 50.1   Vital Signs: Temp: 97.9 F (36.6 C) (11/25 0500) Temp src: Oral (11/25 0500) BP: 167/90 mmHg (11/25 0500) Pulse Rate: 75  (11/25 0500)  Labs:  Basename 06/06/11 0445 06/05/11 0358 06/04/11 0350  HGB -- 9.0* 8.8*  HCT -- 28.0* 27.2*  PLT -- 203 168  APTT -- -- --  LABPROT 23.0* 21.8* 25.2*  INR 2.00* 1.86* 2.24*  HEPARINUNFRC -- -- --  CREATININE -- 0.80 0.72  CKTOTAL -- -- --  CKMB -- -- --  TROPONINI -- -- --   Estimated Creatinine Clearance: 49 ml/min (by C-G formula based on Cr of 0.8).   Medications:  Scheduled:     . ARIPiprazole  5 mg Oral Daily  . aspirin  81 mg Oral Daily  . calcium-vitamin D  1 tablet Oral Daily  . divalproex  250 mg Oral Daily  . divalproex  500 mg Oral QHS  . docusate sodium  100 mg Oral BID  . donepezil  10 mg Oral QHS  . hydrocortisone  1 application Rectal UD  .  HYDROmorphone (DILAUDID) injection  1 mg Intravenous Once  . levothyroxine  150 mcg Oral QAC breakfast  . LORazepam  0.5 mg Oral BID  . memantine  10 mg Oral BID  . mirtazapine  15 mg Oral QHS  . sertraline  100 mg Oral QHS  . sucralfate  1 g Oral BID  . vitamin B-12  1,000 mcg Oral BID  . warfarin  2 mg Oral ONCE-1800   Anticoagulants Lovenox 40mg  SQ 11/21 - 11/23 Coumadin 5mg  11/20, 5mg  11/21, 2.5mg  11/22, 0.5mg  11/23, 2mg  11/24  Assessment:  85 YOF with R hip fracture s/p intramedullary nailing started coumadin 11/20 for VTE prophylaxis  No bleeding/complications reported  INR therapeutic at 2  Goal of Therapy:  INR 2-3   Plan:   Coumadin 2mg  x1 today  F/u daily PT/INR  Lynann Beaver PharmD  Pager [phone] 06/06/2011 11:50 AM

## 2011-06-07 LAB — PROTIME-INR: INR: 1.88 — ABNORMAL HIGH (ref 0.00–1.49)

## 2011-06-07 MED ORDER — ACETAMINOPHEN 325 MG PO TABS: 650.0000 mg | ORAL_TABLET | Freq: Four times a day (QID) | ORAL | Status: AC | PRN

## 2011-06-07 MED ORDER — WARFARIN SODIUM 5 MG PO TABS: 5.0000 mg | ORAL_TABLET | Freq: Every day | ORAL | Status: DC

## 2011-06-07 MED ORDER — WARFARIN SODIUM 2.5 MG PO TABS
2.5000 mg | ORAL_TABLET | Freq: Once | ORAL | Status: DC
  Filled 2011-06-07: qty 1

## 2011-06-07 MED ORDER — DSS 100 MG PO CAPS: 100.0000 mg | ORAL_CAPSULE | Freq: Two times a day (BID) | ORAL | Status: AC

## 2011-06-07 MED ORDER — HYDROCODONE-ACETAMINOPHEN 5-325 MG PO TABS: 1.0000 | ORAL_TABLET | ORAL | Status: AC | PRN

## 2011-06-07 NOTE — Discharge Summary (Signed)
Patient ID: Chanique Duca MRN: [id number] DOB/AGE: Mar 19, 1926 77 y.o.  Admit date: 06/01/2011 Discharge date: 06/07/2011  Admission Diagnoses:  Active Problems:  Hip fracture, right   Discharge Diagnoses:  Same  Past Medical History  Diagnosis Date  . Dementia   . Altered mental status   . Depression   . Hypothyroidism   . Gastritis   . Arthritis   . Migraines   . History of recurrent UTIs   . Alteration in bowel elimination: incontinence   . Urinary bladder incontinence   . Dementia   . Hematoma     Hx of right shin hematoma    Surgeries: Procedure(s): INTRAMEDULLARY (IM) NAIL FEMORAL on 06/01/2011   Consultants:    Discharged Condition: Improved  Hospital Course: Anabeth Chilcott is an 77 y.o. female who was admitted 06/01/2011 for operative treatment of<principal problem not specified>. Patient has severe unremitting pain that affects sleep, daily activities, and work/hobbies. After pre-op clearance the patient was taken to the operating room on 06/01/2011 and underwent  Procedure(s): INTRAMEDULLARY (IM) NAIL FEMORAL.   Patient is non weight bearing on the operative extremity  for physical therapy and has tolerated only bed to chair transfers during the hospital stay.  She is to remain non weight bearing until further notice by Dr Ophelia Charter.  She has urinary incontinence and will need adult diapers and strict skin care to prevent infection and skin breakdown.  Coumadin used for DVT prop. And will continue for 4 weeks post op Patient was given perioperative antibiotics: Anti-infectives     Start     Dose/Rate Route Frequency Ordered Stop   06/01/11 2230   ceFAZolin (ANCEF) IVPB 1 g/50 mL premix        1 g 100 mL/hr over 30 Minutes Intravenous Every 6 hours 06/01/11 2049 06/02/11 1036   06/01/11 1653   ceFAZolin (ANCEF) IVPB 1 g/50 mL premix        1 g 100 mL/hr over 30 Minutes Intravenous 60 min pre-op 06/01/11 1653 06/01/11 1813           Patient was given  sequential compression devices, early ambulation, and chemoprophylaxis to prevent DVT.  Patient benefited maximally from hospital stay and there were no complications.    Recent vital signs: Patient Vitals for the past 24 hrs:  BP Temp Temp src Pulse Resp SpO2  06/07/11 0525 123/85 mmHg 97.9 F (36.6 C) Oral 69  17  97 %  Jun 20, 2011 2115 113/78 mmHg 97.8 F (36.6 C) Oral 86  16  95 %  2011-06-20 1529 117/74 mmHg 98.5 F (36.9 C) Oral 78  18  96 %     Recent laboratory studies:  Basename 06/07/11 0435 June 20, 2011 0445 06/05/11 0358  WBC -- -- 7.4  HGB -- -- 9.0*  HCT -- -- 28.0*  PLT -- -- 203  NA -- -- 141  K -- -- 3.7  CL -- -- 103  CO2 -- -- 33*  BUN -- -- 11  CREATININE -- -- 0.80  GLUCOSE -- -- 98  INR 1.88* 2.00* --  CALCIUM -- -- 8.5     Discharge Medications:   Current Discharge Medication List    START taking these medications   Details  acetaminophen (TYLENOL) 325 MG tablet Take 2 tablets (650 mg total) by mouth every 6 (six) hours as needed (or Fever >/= 101). Qty: 30 tablet, Refills: 0    docusate sodium 100 MG CAPS Take 100 mg by mouth 2 (two) times daily. Qty:  30 capsule, Refills: 0    HYDROcodone-acetaminophen (NORCO) 5-325 MG per tablet Take 1-2 tablets by mouth every 4 (four) hours as needed. Qty: 30 tablet, Refills: 0    warfarin (COUMADIN) 5 MG tablet Take 1 tablet (5 mg total) by mouth daily. Qty: 30 tablet, Refills: 0      CONTINUE these medications which have NOT CHANGED   Details  alendronate (FOSAMAX) 70 MG tablet Take 70 mg by mouth every 7 (seven) days. Take with a full glass of water on an empty stomach.     ARIPiprazole (ABILIFY) 5 MG tablet Take 5 mg by mouth every morning.      Calcium-Vitamin D (CALTRATE 600 PLUS-VIT D PO) Take 600 mg by mouth daily.      !! divalproex (DEPAKOTE ER) 250 MG 24 hr tablet Take 500 mg by mouth at bedtime.      !! divalproex (DEPAKOTE ER) 250 MG 24 hr tablet Take 250 mg by mouth every morning.        donepezil (ARICEPT) 10 MG tablet Take 10 mg by mouth at bedtime.      levothyroxine (SYNTHROID, LEVOTHROID) 150 MCG tablet Take 150 mcg by mouth daily.      LORazepam (ATIVAN) 0.5 MG tablet Take 0.5 mg by mouth every 12 (twelve) hours as needed. For anxiety or agitation     memantine (NAMENDA) 10 MG tablet Take 10 mg by mouth 2 (two) times daily.      mirtazapine (REMERON) 15 MG tablet Take 15 mg by mouth at bedtime.      Multiple Vitamin (MULTIVITAMIN) tablet Take 1 tablet by mouth daily.      sertraline (ZOLOFT) 100 MG tablet Take 100 mg by mouth at bedtime.      sucralfate (CARAFATE) 1 G tablet Take 1 g by mouth 2 (two) times daily.      vitamin B-12 (CYANOCOBALAMIN) 1000 MCG tablet Take 1,000 mcg by mouth 2 (two) times daily.      hydrocortisone (ANUSOL-HC) 2.5 % rectal cream Place 1 application rectally as directed. For rectal discomfort.      !! - Potential duplicate medications found. Please discuss with provider.    STOP taking these medications     aspirin 81 MG chewable tablet      fentaNYL (SUBLIMAZE) 0.05 MG/ML injection         Diagnostic Studies: Dg Chest 2 View  06/01/2011  *RADIOLOGY REPORT*  Clinical Data: Preop for right hip fixation.  Nonsmoker.  No chest complaints.  CHEST - 2 VIEW  Comparison: 04/19/2011  Findings: Mid thoracic compression deformity is again identified and unchanged.  No canal compromise. Lateral view degraded by patient arm position.  Mild osteopenia. Midline trachea.  Moderate cardiomegaly.  Tortuous thoracic aorta. No pleural effusion or pneumothorax.  Question right upper lobe reticular nodular opacity.  Apparent laterally on the frontal view.  Similar to on the prior exam but not readily apparent on 02/14/2010.  IMPRESSION:  1. Cardiomegaly without congestive failure. 2.  Chronic moderate mid thoracic compression deformity. 3.  Subtle/equivocal lateral right upper lobe reticulonodular opacity.  Favored to represent an area of scarring.   Plain film follow-up at 6 months versus further characterization with CT should be considered.  Original Report Authenticated By: Consuello Bossier, M.D.   Dg Hip Complete Right  06/01/2011  *RADIOLOGY REPORT*  Clinical Data: Trauma and pain.  RIGHT HIP - COMPLETE 2+ VIEW  Comparison: 02/14/2010  Findings: Both femoral heads are located.  Surgical clips in  the right hemi pelvis. Sacroiliac joints are symmetric.  Mild osteopenia.  Dedicated views of the right hip demonstrate a comminuted intertrochanteric femur fracture.  Mild varus angulation.  IMPRESSION: Comminuted intertrochanteric right femur fracture. Per CMS PQRS reporting requirements (PQRS Measure 24): Given the patient's age of greater than 50 and the fracture site (hip, distal radius, or spine), the patient should be tested for osteoporosis using DXA, and the appropriate treatment considered based on the DXA results.  Original Report Authenticated By: Consuello Bossier, M.D.   Dg Femur Right  06/01/2011  *RADIOLOGY REPORT*  Clinical Data:  ORIF of right hip fracture  RIGHT FEMUR - 2 VIEW  Comparison: 06/01/2011  Findings: Four digital C-arm fluoroscopic images submitted for interpretation. Examination is interpreted postoperatively.  Images demonstrate placement of an IM nail with compression screw at the proximal right femur across a mildly displaced intertrochanteric fracture. Bones appear demineralized. Single distal locking screw is seen at the distal right femoral metadiaphysis. No femoral head dislocation or acute complication identified.  IMPRESSION: Post ORIF of intertrochanteric fracture right femur.  Original Report Authenticated By: Lollie Marrow, M.D.   Dg C-arm 1-60 Min-no Report  06/01/2011  CLINICAL DATA: fx   C-ARM 1-60 MINUTES  Fluoroscopy was utilized by the requesting physician.  No radiographic  interpretation.      [facility]  Discharge Orders    Future Orders Please Complete By Expires   Diet - low  sodium heart healthy      Change dressing      Comments:   Apply mepilex to wounds. Change before discharge to nursing home and daily at facility   Call MD / Call 911      Comments:   If you experience chest pain or shortness of breath, CALL 911 and be transported to the hospital emergency room.  If you develope a fever above 101 F, pus (white drainage) or increased drainage or redness at the wound, or calf pain, call your surgeon's office.   Constipation Prevention      Comments:   Drink plenty of fluids.  Prune juice may be helpful.  You may use a stool softener, such as Colace (over the counter) 100 mg twice a day.  Use MiraLax (over the counter) for constipation as needed.   Increase activity slowly as tolerated      Weight Bearing as taught in Physical Therapy      Comments:   Use a walker or crutches as instructed.   Do not sit on low chairs, stoools or toilet seats, as it may be difficult to get up from low surfaces      Discharge wound care:      Scheduling Instructions:   Change dressing daily using mepilex or tegaderm.  Must keep wound covered as pt is incontinent to urine.  Close observation of skin for breakdown   Comments:   If you have a hip bandage, keep it clean and dry.  Change your bandage as instructed by your health care providers.  If your bandage has been discontinued, keep your incision clean and dry.  Pat dry after bathing.  DO NOT put lotion or powder on your incision.   DO NOT drive, shower or take a tub bath until instructed by your physician         Follow-up Information    Follow up with YATES,MARK C in 1 week.   Contact information:   [facility] [address] [place]  [address] [phone]           Signed: Wende Neighbors 06/07/2011, 9:06 AM

## 2011-06-07 NOTE — Progress Notes (Signed)
ANTICOAGULATION CONSULT NOTE - Follow Up Consult  Pharmacy Consult for Coumadin Indication: VTE prophylaxis   No Known Allergies  Patient Measurements: Height: 5\' 2"  (157.5 cm) Weight: 155 lb (70.308 kg) IBW/kg (Calculated) : 50.1   Vital Signs: Temp: 97.9 F (36.6 C) (11/26 0525) Temp src: Oral (11/26 0525) BP: 123/85 mmHg (11/26 0525) Pulse Rate: 90  (11/26 1025)  Labs:  Basename 06/07/11 0435 06/06/11 0445 06/05/11 0358  HGB -- -- 9.0*  HCT -- -- 28.0*  PLT -- -- 203  APTT -- -- --  LABPROT 21.9* 23.0* 21.8*  INR 1.88* 2.00* 1.86*  HEPARINUNFRC -- -- --  CREATININE -- -- 0.80  CKTOTAL -- -- --  CKMB -- -- --  TROPONINI -- -- --   Estimated Creatinine Clearance: 49 ml/min (by C-G formula based on Cr of 0.8).   Medications:  Scheduled:     . ARIPiprazole  5 mg Oral Daily  . aspirin  81 mg Oral Daily  . calcium-vitamin D  1 tablet Oral Daily  . divalproex  250 mg Oral Daily  . divalproex  500 mg Oral QHS  . docusate sodium  100 mg Oral BID  . donepezil  10 mg Oral QHS  . hydrocortisone  1 application Rectal UD  . levothyroxine  150 mcg Oral QAC breakfast  . LORazepam  0.5 mg Oral BID  . memantine  10 mg Oral BID  . mirtazapine  15 mg Oral QHS  . sertraline  100 mg Oral QHS  . sucralfate  1 g Oral BID  . vitamin B-12  1,000 mcg Oral BID  . warfarin  2 mg Oral ONCE-1800  . DISCONTD:  HYDROmorphone (DILAUDID) injection  1 mg Intravenous Once   Anticoagulants Lovenox 40mg  SQ 11/21 - 11/23 Coumadin 5mg  11/20, 5mg  11/21, 2.5mg  11/22, 0.5mg  11/23, 2mg  11/24  Assessment:  85 YOF with R hip fracture s/p intramedullary nailing started coumadin 11/20 for VTE prophylaxis  No bleeding/complications reported  INR 1.88 today  Goal of Therapy:  INR 2-3   Plan:   Coumadin 2.5mg  today, if pt is not discharged to [facility]  Coumadin script written for 5mg  daily, anticipate her daily needs will be closer to Coumadin 2.5mg  daily.  Recommend PT/INR  tomorrow 11/27 at SNF Perlie Gold Pharm D 06/07/2011 12:02 PM

## 2011-06-07 NOTE — Progress Notes (Signed)
Pt discharged to SNF with ambulance transportation.  A&O x4.  Denies pain.  R hip dressing CDI.  No IV access during shift.  Pt aware of discharge instructions sent to Skilled Nursing Facility.  No other concerns at this time.  Barrie Lyme 06/07/2011 5:33 PM

## 2011-06-07 NOTE — Discharge Summary (Deleted)
Patient ID: Bonnie Riley MRN: [id number] DOB/AGE: 75-Sep-1927 75 y.o.  Admit date: 06/01/2011 Discharge date: 06/07/2011  Admission Diagnoses:  Active Problems:  Hip fracture, right   Discharge Diagnoses:  Same  Past Medical History  Diagnosis Date  . Dementia   . Altered mental status   . Depression   . Hypothyroidism   . Gastritis   . Arthritis   . Migraines   . History of recurrent UTIs   . Alteration in bowel elimination: incontinence   . Urinary bladder incontinence   . Dementia   . Hematoma     Hx of right shin hematoma    Surgeries: Procedure(s): INTRAMEDULLARY (IM) NAIL FEMORAL on 06/01/2011   Consultants:none    Discharged Condition: Improved  Hospital Course: Bonnie Riley is an 77 y.o. female who was admitted 06/01/2011 for operative treatment of<principal problem not specified>. Patient has severe unremitting pain that affects sleep, daily activities, and work/hobbies. After pre-op clearance the patient was taken to the operating room on 06/01/2011 and underwent  Procedure(s): INTRAMEDULLARY (IM) NAIL FEMORAL.    Patient was given perioperative antibiotics: Anti-infectives     Start     Dose/Rate Route Frequency Ordered Stop   06/01/11 2230   ceFAZolin (ANCEF) IVPB 1 g/50 mL premix        1 g 100 mL/hr over 30 Minutes Intravenous Every 6 hours 06/01/11 2049 06/02/11 1036   06/01/11 1653   ceFAZolin (ANCEF) IVPB 1 g/50 mL premix        1 g 100 mL/hr over 30 Minutes Intravenous 60 min pre-op 06/01/11 1653 06/01/11 1813           Patient was given sequential compression devices, early ambulation, and chemoprophylaxis to prevent DVT.  Patient benefited maximally from hospital stay and there were no complications.    Recent vital signs: Patient Vitals for the past 24 hrs:  BP Temp Temp src Pulse Resp SpO2  06/07/11 0525 123/85 mmHg 97.9 F (36.6 C) Oral 69  17  97 %  01-Jul-2011 2115 113/78 mmHg 97.8 F (36.6 C) Oral 86  16  95 %  01-Jul-2011  1529 117/74 mmHg 98.5 F (36.9 C) Oral 78  18  96 %     Recent laboratory studies:  Basename 06/07/11 0435 Jul 01, 2011 0445 06/05/11 0358  WBC -- -- 7.4  HGB -- -- 9.0*  HCT -- -- 28.0*  PLT -- -- 203  NA -- -- 141  K -- -- 3.7  CL -- -- 103  CO2 -- -- 33*  BUN -- -- 11  CREATININE -- -- 0.80  GLUCOSE -- -- 98  INR 1.88* 2.00* --  CALCIUM -- -- 8.5     Discharge Medications:   Current Discharge Medication List    START taking these medications   Details  acetaminophen (TYLENOL) 325 MG tablet Take 2 tablets (650 mg total) by mouth every 6 (six) hours as needed (or Fever >/= 101). Qty: 30 tablet, Refills: 0    docusate sodium 100 MG CAPS Take 100 mg by mouth 2 (two) times daily. Qty: 30 capsule, Refills: 0    HYDROcodone-acetaminophen (NORCO) 5-325 MG per tablet Take 1-2 tablets by mouth every 4 (four) hours as needed. Qty: 30 tablet, Refills: 0    warfarin (COUMADIN) 5 MG tablet Take 1 tablet (5 mg total) by mouth daily. Qty: 30 tablet, Refills: 0      CONTINUE these medications which have NOT CHANGED   Details  alendronate (FOSAMAX) 70 MG tablet  Take 70 mg by mouth every 7 (seven) days. Take with a full glass of water on an empty stomach.     ARIPiprazole (ABILIFY) 5 MG tablet Take 5 mg by mouth every morning.      Calcium-Vitamin D (CALTRATE 600 PLUS-VIT D PO) Take 600 mg by mouth daily.      !! divalproex (DEPAKOTE ER) 250 MG 24 hr tablet Take 500 mg by mouth at bedtime.      !! divalproex (DEPAKOTE ER) 250 MG 24 hr tablet Take 250 mg by mouth every morning.      donepezil (ARICEPT) 10 MG tablet Take 10 mg by mouth at bedtime.      levothyroxine (SYNTHROID, LEVOTHROID) 150 MCG tablet Take 150 mcg by mouth daily.      LORazepam (ATIVAN) 0.5 MG tablet Take 0.5 mg by mouth every 12 (twelve) hours as needed. For anxiety or agitation     memantine (NAMENDA) 10 MG tablet Take 10 mg by mouth 2 (two) times daily.      mirtazapine (REMERON) 15 MG tablet Take 15 mg by  mouth at bedtime.      Multiple Vitamin (MULTIVITAMIN) tablet Take 1 tablet by mouth daily.      sertraline (ZOLOFT) 100 MG tablet Take 100 mg by mouth at bedtime.      sucralfate (CARAFATE) 1 G tablet Take 1 g by mouth 2 (two) times daily.      vitamin B-12 (CYANOCOBALAMIN) 1000 MCG tablet Take 1,000 mcg by mouth 2 (two) times daily.      hydrocortisone (ANUSOL-HC) 2.5 % rectal cream Place 1 application rectally as directed. For rectal discomfort.      !! - Potential duplicate medications found. Please discuss with provider.    STOP taking these medications     aspirin 81 MG chewable tablet      fentaNYL (SUBLIMAZE) 0.05 MG/ML injection         Diagnostic Studies: Dg Chest 2 View  06/01/2011  *RADIOLOGY REPORT*  Clinical Data: Preop for right hip fixation.  Nonsmoker.  No chest complaints.  CHEST - 2 VIEW  Comparison: 04/19/2011  Findings: Mid thoracic compression deformity is again identified and unchanged.  No canal compromise. Lateral view degraded by patient arm position.  Mild osteopenia. Midline trachea.  Moderate cardiomegaly.  Tortuous thoracic aorta. No pleural effusion or pneumothorax.  Question right upper lobe reticular nodular opacity.  Apparent laterally on the frontal view.  Similar to on the prior exam but not readily apparent on 02/14/2010.  IMPRESSION:  1. Cardiomegaly without congestive failure. 2.  Chronic moderate mid thoracic compression deformity. 3.  Subtle/equivocal lateral right upper lobe reticulonodular opacity.  Favored to represent an area of scarring.  Plain film follow-up at 6 months versus further characterization with CT should be considered.  Original Report Authenticated By: Consuello Bossier, M.D.   Dg Hip Complete Right  06/01/2011  *RADIOLOGY REPORT*  Clinical Data: Trauma and pain.  RIGHT HIP - COMPLETE 2+ VIEW  Comparison: 02/14/2010  Findings: Both femoral heads are located.  Surgical clips in the right hemi pelvis. Sacroiliac joints are symmetric.   Mild osteopenia.  Dedicated views of the right hip demonstrate a comminuted intertrochanteric femur fracture.  Mild varus angulation.  IMPRESSION: Comminuted intertrochanteric right femur fracture. Per CMS PQRS reporting requirements (PQRS Measure 24): Given the patient's age of greater than 50 and the fracture site (hip, distal radius, or spine), the patient should be tested for osteoporosis using DXA, and the appropriate treatment  considered based on the DXA results.  Original Report Authenticated By: Consuello Bossier, M.D.   Dg Femur Right  06/01/2011  *RADIOLOGY REPORT*  Clinical Data:  ORIF of right hip fracture  RIGHT FEMUR - 2 VIEW  Comparison: 06/01/2011  Findings: Four digital C-arm fluoroscopic images submitted for interpretation. Examination is interpreted postoperatively.  Images demonstrate placement of an IM nail with compression screw at the proximal right femur across a mildly displaced intertrochanteric fracture. Bones appear demineralized. Single distal locking screw is seen at the distal right femoral metadiaphysis. No femoral head dislocation or acute complication identified.  IMPRESSION: Post ORIF of intertrochanteric fracture right femur.  Original Report Authenticated By: Lollie Marrow, M.D.   Dg C-arm 1-60 Min-no Report  06/01/2011  CLINICAL DATA: fx   C-ARM 1-60 MINUTES  Fluoroscopy was utilized by the requesting physician.  No radiographic  interpretation.      Disposition skilled nursing facility  Discharge Orders    Future Orders Please Complete By Expires   Diet - low sodium heart healthy      Change dressing      Comments:   Apply mepilex to wounds. Change before discharge to nursing home and daily at facility   Call MD / Call 911      Comments:   If you experience chest pain or shortness of breath, CALL 911 and be transported to the hospital emergency room.  If you develope a fever above 101 F, pus (white drainage) or increased drainage or redness at the wound, or  calf pain, call your surgeon's office.   Constipation Prevention      Comments:   Drink plenty of fluids.  Prune juice may be helpful.  You may use a stool softener, such as Colace (over the counter) 100 mg twice a day.  Use MiraLax (over the counter) for constipation as needed.   Increase activity slowly as tolerated      Weight Bearing as taught in Physical Therapy      Comments:   Use a walker or crutches as instructed.   Do not sit on low chairs, stoools or toilet seats, as it may be difficult to get up from low surfaces      Discharge wound care:      Scheduling Instructions:   Change dressing daily using mepilex or tegaderm.  Must keep wound covered as pt is incontinent to urine.  Close observation of skin for breakdown   Comments:   If you have a hip bandage, keep it clean and dry.  Change your bandage as instructed by your health care providers.  If your bandage has been discontinued, keep your incision clean and dry.  Pat dry after bathing.  DO NOT put lotion or powder on your incision.   DO NOT drive, shower or take a tub bath until instructed by your physician         Follow-up Information    Follow up with YATES,MARK C in 1 week.   Contact information:   [facility] [address] [place] [address] [phone]           Signed: Wende Neighbors 06/07/2011, 9:00 AM

## 2011-06-07 NOTE — Progress Notes (Signed)
Subjective: 6 Days Post-Op Procedure(s) (LRB): INTRAMEDULLARY (IM) NAIL FEMORAL (Right) Patient reports pain as mild.   Pt has multiple complaints.  Reports she has not had therapy since she has been here and only sat in the chair yesterday.  PT reports reviewed and pt's cognitive level inhibiting her from participating fully with therapies.  C/o incontinence to bladder and wishes to wear depends.  Pt stable for discharge to rehab facility today. Wants O2 off. Objective: Vital signs in last 24 hours: Temp:  [97.8 F (36.6 C)-98.5 F (36.9 C)] 97.9 F (36.6 C) (11/26 0525) Pulse Rate:  [69-86] 69  (11/26 0525) Resp:  [16-18] 17  (11/26 0525) BP: (113-123)/(74-85) 123/85 mmHg (11/26 0525) SpO2:  [95 %-97 %] 97 % (11/26 0525)  Intake/Output from previous day: 11/25 0701 - 11/26 0700 In: 900 [P.O.:900] Out: -  Intake/Output this shift:     Basename 06/05/11 0358  HGB 9.0*    Basename 06/05/11 0358  WBC 7.4  RBC 2.96*  HCT 28.0*  PLT 203    Basename 06/05/11 0358  NA 141  K 3.7  CL 103  CO2 33*  BUN 11  CREATININE 0.80  GLUCOSE 98  CALCIUM 8.5    Basename 06/07/11 0435 06/06/11 0445  LABPT -- --  INR 1.88* 2.00*    Neurovascular intact Intact pulses distally Dorsiflexion/Plantar flexion intact Incision: no drainage  Assessment/Plan: 6 Days Post-Op Procedure(s) (LRB): INTRAMEDULLARY (IM) NAIL FEMORAL (Right) Discharge to SNF D/c O2 Continue PT and OT at facility. OV one week with Dr Ophelia Charter Dressing change now and then daily with mepilex Depends for urinary incontinence  Pt states daughter can bring from home.   Will need strict skin care with incontinence  Keyonna Comunale M 06/07/2011, 8:27 AM

## 2011-06-07 NOTE — Progress Notes (Signed)
Physical Therapy Treatment Patient Details Name: Bonnie Riley MRN: [id number] DOB: 1926-01-02 Today's Date: 06/07/2011 1400-1430 scta PT Assessment/Plan  PT - Assessment/Plan Comments on Treatment Session: Pt did want to be OOB, thought she had not been up except x1 since admit.  Pt did state dtr was to meet her at the facility, which was true, a recent  bed available  PT Plan: Discharge plan remains appropriate PT Frequency: Min 3X/week Follow Up Recommendations: Skilled nursing facility Equipment Recommended: Defer to next venue PT Goals  Acute Rehab PT Goals PT Goal Formulation: With patient Time For Goal Achievement: 2 weeks Pt will go Supine/Side to Sit: with mod assist PT Goal: Supine/Side to Sit - Progress: Progressing toward goal Pt will go Sit to Supine/Side: with mod assist PT Goal: Sit to Supine/Side - Progress: Progressing toward goal Pt will Transfer Sit to Stand/Stand to Sit: with mod assist PT Transfer Goal: Sit to Stand/Stand to Sit - Progress: Progressing toward goal Pt will Transfer Bed to Chair/Chair to Bed: with mod assist PT Transfer Goal: Bed to Chair/Chair to Bed - Progress: Progressing toward goal Pt will Stand: with min assist PT Goal: Stand - Progress: Progressing toward goal  PT Treatment Precautions/Restrictions  Restrictions Weight Bearing Restrictions: Yes RLE Weight Bearing: Non weight bearing Mobility (including Balance) Bed Mobility Bed Mobility: Yes Rolling Right: 1: +2 Total assist Rolling Right Details (indicate cue type and reason): TC to reach for rail,pt=5% Rolling Left: 1: +2 Total assist;With rail Rolling Left Details (indicate cue type and reason): pt= 10% Supine to Sit: 1: +2 Total assist Supine to Sit Details (indicate cue type and reason): pt=5% Sitting - Scoot to Edge of Bed: 1: +2 Total assist Sitting - Scoot to Edge of Bed Details (indicate cue type and reason): pt=5% Sit to Supine - Left:  (maximove back to  bed) Transfers Transfers: Yes Squat Pivot Transfers: 1: +2 Total assist Squat Pivot Transfer Details (indicate cue type and reason): pt=15% utilizing 2person lift with BilUEs, not RW Ambulation/Gait Ambulation/Gait: No Stairs: No Wheelchair Mobility Wheelchair Mobility: No  Posture/Postural Control Posture/Postural Control: Postural limitations Postural Limitations: pt initially leans posteriorly, gradually assumes some control while sitting on EOB Exercise  General Exercises - Lower Extremity Ankle Circles/Pumps: AROM;10 reps;Right;Left;Supine Heel Slides: AAROM;Right;10 reps End of Session PT - End of Session Equipment Utilized During Treatment:  (Maximove lift back to bed as pt has SNF bed today) Activity Tolerance: Patient limited by pain Patient left: in bed;with call bell in reach (pt assisted to recliner, sat 10 minutes then returned to bed) Nurse Communication: Need for lift equipment General Behavior During Session: [facility] for tasks performed Cognition: Impaired, at baseline  [name] 06/07/2011, 2:57 PM

## 2011-06-07 NOTE — Progress Notes (Signed)
CSW assisted pt/family with D/C planning. Pt plans to D/C to [facility] via [facility] transport today.

## 2012-02-11 ENCOUNTER — Emergency Department (HOSPITAL_COMMUNITY): Payer: Medicare Other

## 2012-02-11 ENCOUNTER — Emergency Department (HOSPITAL_COMMUNITY)
Admission: EM | Admit: 2012-02-11 | Discharge: 2012-02-11 | Disposition: A | Discharge status: Skilled Nursing Facility | Payer: Medicare Other | Attending: Emergency Medicine | Admitting: Emergency Medicine

## 2012-02-11 ENCOUNTER — Encounter (HOSPITAL_COMMUNITY): Payer: Self-pay | Admitting: Emergency Medicine

## 2012-02-11 DIAGNOSIS — Y92009 Unspecified place in unspecified non-institutional (private) residence as the place of occurrence of the external cause: Secondary | ICD-10-CM | POA: Insufficient documentation

## 2012-02-11 DIAGNOSIS — Z7982 Long term (current) use of aspirin: Secondary | ICD-10-CM | POA: Insufficient documentation

## 2012-02-11 DIAGNOSIS — S42309A Unspecified fracture of shaft of humerus, unspecified arm, initial encounter for closed fracture: Secondary | ICD-10-CM | POA: Insufficient documentation

## 2012-02-11 DIAGNOSIS — M25519 Pain in unspecified shoulder: Secondary | ICD-10-CM | POA: Insufficient documentation

## 2012-02-11 DIAGNOSIS — Z79899 Other long term (current) drug therapy: Secondary | ICD-10-CM | POA: Insufficient documentation

## 2012-02-11 DIAGNOSIS — F3289 Other specified depressive episodes: Secondary | ICD-10-CM | POA: Insufficient documentation

## 2012-02-11 DIAGNOSIS — F329 Major depressive disorder, single episode, unspecified: Secondary | ICD-10-CM | POA: Insufficient documentation

## 2012-02-11 DIAGNOSIS — F0390 Unspecified dementia without behavioral disturbance: Secondary | ICD-10-CM | POA: Insufficient documentation

## 2012-02-11 DIAGNOSIS — Z8739 Personal history of other diseases of the musculoskeletal system and connective tissue: Secondary | ICD-10-CM | POA: Insufficient documentation

## 2012-02-11 DIAGNOSIS — S42301A Unspecified fracture of shaft of humerus, right arm, initial encounter for closed fracture: Secondary | ICD-10-CM

## 2012-02-11 DIAGNOSIS — W06XXXA Fall from bed, initial encounter: Secondary | ICD-10-CM | POA: Insufficient documentation

## 2012-02-11 DIAGNOSIS — E039 Hypothyroidism, unspecified: Secondary | ICD-10-CM | POA: Insufficient documentation

## 2012-02-11 DIAGNOSIS — R109 Unspecified abdominal pain: Secondary | ICD-10-CM | POA: Insufficient documentation

## 2012-02-11 LAB — CARDIAC PANEL(CRET KIN+CKTOT+MB+TROPI): Relative Index: 3 — ABNORMAL HIGH (ref 0.0–2.5)

## 2012-02-11 LAB — CBC WITH DIFFERENTIAL/PLATELET
Eosinophils Absolute: 0.2 10*3/uL (ref 0.0–0.7)
Eosinophils Relative: 3 % (ref 0–5)
Lymphs Abs: 1 10*3/uL (ref 0.7–4.0)
MCH: 31.6 pg (ref 26.0–34.0)
MCV: 94.6 fL (ref 78.0–100.0)
Monocytes Absolute: 0.9 10*3/uL (ref 0.1–1.0)
Monocytes Relative: 12 % (ref 3–12)
Platelets: 231 10*3/uL (ref 150–400)
RBC: 4.24 MIL/uL (ref 3.87–5.11)

## 2012-02-11 LAB — BASIC METABOLIC PANEL
BUN: 24 mg/dL — ABNORMAL HIGH (ref 6–23)
Calcium: 9.4 mg/dL (ref 8.4–10.5)
Creatinine, Ser: 1.03 mg/dL (ref 0.50–1.10)
GFR calc non Af Amer: 49 mL/min — ABNORMAL LOW (ref >90)
Glucose, Bld: 124 mg/dL — ABNORMAL HIGH (ref 70–99)
Sodium: 141 mEq/L (ref 135–145)

## 2012-02-11 MED ORDER — IOHEXOL 300 MG/ML  SOLN
100.0000 mL | Freq: Once | INTRAMUSCULAR | Status: AC | PRN
  Administered 2012-02-11: 100 mL via INTRAVENOUS

## 2012-02-11 MED ORDER — FENTANYL CITRATE 0.05 MG/ML IJ SOLN
50.0000 ug | Freq: Once | INTRAMUSCULAR | Status: AC
  Administered 2012-02-11: 50 ug via INTRAVENOUS
  Filled 2012-02-11: qty 2

## 2012-02-11 MED ORDER — HYDROCODONE-ACETAMINOPHEN 5-325 MG PO TABS: 1.0000 | ORAL_TABLET | Freq: Three times a day (TID) | ORAL | Status: AC | PRN

## 2012-02-11 MED ORDER — HYDROCODONE-ACETAMINOPHEN 5-325 MG PO TABS
1.0000 | ORAL_TABLET | Freq: Once | ORAL | Status: AC
  Administered 2012-02-11: 1 via ORAL
  Filled 2012-02-11: qty 1

## 2012-02-11 NOTE — ED Notes (Signed)
Pt here via Ems s/p fall stumbled and fell on  Belly c/o rt arm pain and leg pain denies loc Pt resides at [facility] pt stated she hit her head arm in sling aox3 No bleed thinner just q asa

## 2012-02-11 NOTE — ED Provider Notes (Signed)
History     CSN: [id number]  Arrival date & time 02/11/12  1346   First MD Initiated Contact with Patient 02/11/12 1400      Chief Complaint  Patient presents with  . Fall    (Consider location/radiation/quality/duration/timing/severity/associated sxs/prior treatment) HPI Comments: Patient presents from home after she stumbled between her couch in her bed and fell onto her right side. She complains of pain in her right shoulder and right-sided abdomen. She states she did hear it hit her head but did not lose consciousness. She denies any neck pain, back pain, chest pain. Contrary to triage note she denies right leg pain. She is not on Coumadin. Denies any dizziness, lightheadedness, syncope.  The history is provided by the patient and a relative.    Past Medical History  Diagnosis Date  . Dementia   . Altered mental status   . Depression   . Hypothyroidism   . Gastritis   . Arthritis   . Migraines   . History of recurrent UTIs   . Alteration in bowel elimination: incontinence   . Urinary bladder incontinence   . Dementia   . Hematoma     Hx of right shin hematoma    Past Surgical History  Procedure Date  . Tonsillectomy   . Femur im nail 06/01/2011    Procedure: INTRAMEDULLARY (IM) NAIL FEMORAL;  Surgeon: Eldred Manges;  Location: [facility];  Service: Orthopedics;  Laterality: Right;    History reviewed. No pertinent family history.  History  Substance Use Topics  . Smoking status: Never Smoker   . Smokeless tobacco: Never Used  . Alcohol Use: No    OB History    Grav Para Term Preterm Abortions TAB SAB Ect Mult Living                  Review of Systems  Constitutional: Negative for activity change and appetite change.  HENT: Negative for congestion and rhinorrhea.   Respiratory: Negative for cough, chest tightness and shortness of breath.   Cardiovascular: Negative for chest pain.  Gastrointestinal: Positive for abdominal pain. Negative for nausea.    Genitourinary: Negative for dysuria and hematuria.  Musculoskeletal: Positive for myalgias and arthralgias. Negative for back pain.  Skin: Negative for rash.  Neurological: Negative for dizziness, syncope, light-headedness and headaches.    Allergies  Review of patient's allergies indicates no known allergies.  Home Medications   Current Outpatient Rx  Name Route Sig Dispense Refill  . ARIPIPRAZOLE 5 MG PO TABS Oral Take 5 mg by mouth every morning.      . ASPIRIN 81 MG PO CHEW Oral Chew 81 mg by mouth daily.    Marland Kitchen CALTRATE 600 PLUS-VIT D PO Oral Take 600 mg by mouth daily.      Marland Kitchen DIVALPROEX SODIUM 125 MG PO CPSP Oral Take 250-500 mg by mouth 2 (two) times daily. Take 250 mg in the am and 500 mg in the evening    . DONEPEZIL HCL 10 MG PO TABS Oral Take 10 mg by mouth at bedtime.      Marland Kitchen HYDROCODONE-ACETAMINOPHEN 5-325 MG PO TABS Oral Take 1 tablet by mouth every 6 (six) hours as needed. For pain    . LEVOTHYROXINE SODIUM 175 MCG PO TABS Oral Take 175 mcg by mouth daily.    Marland Kitchen LOPERAMIDE HCL 2 MG PO CAPS Oral Take 2 mg by mouth 4 (four) times daily as needed. For loose stool    . LORAZEPAM 0.5 MG  PO TABS Oral Take 0.5 mg by mouth every 12 (twelve) hours as needed. For anxiety or agitation     . MEMANTINE HCL 10 MG PO TABS Oral Take 10 mg by mouth 2 (two) times daily.      Marland Kitchen MIRTAZAPINE 15 MG PO TABS Oral Take 15 mg by mouth at bedtime.      . OCUVITE-LUTEIN PO CAPS Oral Take 1 capsule by mouth daily.    . SENNA-DOCUSATE SODIUM 8.6-50 MG PO TABS Oral Take 2 tablets by mouth daily as needed. For constipation    . SERTRALINE HCL 100 MG PO TABS Oral Take 100 mg by mouth at bedtime.      . SUCRALFATE 1 G PO TABS Oral Take 1 g by mouth 2 (two) times daily.      Marland Kitchen VITAMIN B-12 1000 MCG PO TABS Oral Take 1,000 mcg by mouth daily.     Marland Kitchen HYDROCORTISONE 2.5 % RE CREA Rectal Place 1 application rectally as directed. For rectal discomfort.       BP 154/73  Pulse 65  Temp 97.7 F (36.5 C) (Oral)   Resp 16  SpO2 96%  Physical Exam  Constitutional: She is oriented to person, place, and time. She appears well-developed and well-nourished. No distress.  HENT:  Head: Normocephalic and atraumatic.  Mouth/Throat: Oropharynx is clear and moist. No oropharyngeal exudate.  Eyes: Conjunctivae and EOM are normal. Pupils are equal, round, and reactive to light.  Neck: Normal range of motion. Neck supple.       No C spine pain, stepoff or deformity.  Cardiovascular: Normal rate, regular rhythm and normal heart sounds.   No murmur heard. Pulmonary/Chest: Effort normal and breath sounds normal. No respiratory distress.  Abdominal: Soft. There is tenderness. There is no rebound and no guarding.       TTP R abdominal without ecchymosis or guarding.  Musculoskeletal: Normal range of motion. She exhibits tenderness.       Right shoulder tender to palpation anterior laterally. No obvious deformity. Posterior radial pulse, cardinal hand movements intact, axillary nerve sensation intact.  Neurological: She is alert and oriented to person, place, and time. No cranial nerve deficit.  Skin: Skin is warm.    ED Course  Procedures (including critical care time)  Labs Reviewed  BASIC METABOLIC PANEL - Abnormal; Notable for the following:    Glucose, Bld 124 (*)     BUN 24 (*)     GFR calc non Af Amer 49 (*)     GFR calc Af Amer 56 (*)     All other components within normal limits  CARDIAC PANEL(CRET KIN+CKTOT+MB+TROPI) - Abnormal; Notable for the following:    Relative Index 3.0 (*)     All other components within normal limits  CBC WITH DIFFERENTIAL  PROTIME-INR   No results found.   No diagnosis found.    MDM  Mechanical fall with R shoulder pain and abdominal pain.  No LOC.  Neurologically intact. Hemoglobin stable.  CT head, C spine, abdomen/pelvis, RUE plain films pending at time of sign out to Dr. Ethelda Chick. Possible proximal humerus fracture suspected.      Date: 02/11/2012   Rate: 64  Rhythm: normal sinus rhythm  QRS Axis: normal  Intervals: normal  ST/T Wave abnormalities: nonspecific ST/T changes  Conduction Disutrbances:none  Narrative Interpretation:   Old EKG Reviewed: unchanged    Glynn Octave, MD 02/11/12 1624

## 2012-02-11 NOTE — ED Notes (Signed)
States was walking between table and chair, and doesn't remember what happened, but fell between and hit head on bed, and bent right arm backwards. No obvious bruising to forehead, pain in right humerus, states unable to move it. Positive radial pulse, good cap refill.   Has eaten lunch at 11:30.

## 2012-02-11 NOTE — ED Provider Notes (Signed)
Patient tripped and fell fell this afternoon. Injuring right arm. Presently complains of right arm pain no other complaint On exam alert costochondral score 15 with no extremities X-rays reviewed by me. Patient comfortable after treatment with shoulder immobilizer and hydrocodone-apap Results for orders placed during the hospital encounter of 02/11/12  CBC WITH DIFFERENTIAL      Component Value Range   WBC 7.6  4.0 - 10.5 K/uL   RBC 4.24  3.87 - 5.11 MIL/uL   Hemoglobin 13.4  12.0 - 15.0 g/dL   HCT 45.4  09.8 - 11.9 %   MCV 94.6  78.0 - 100.0 fL   MCH 31.6  26.0 - 34.0 pg   MCHC 33.4  30.0 - 36.0 g/dL   RDW 14.7  82.9 - 56.2 %   Platelets 231  150 - 400 K/uL   Neutrophils Relative 71  43 - 77 %   Neutro Abs 5.4  1.7 - 7.7 K/uL   Lymphocytes Relative 14  12 - 46 %   Lymphs Abs 1.0  0.7 - 4.0 K/uL   Monocytes Relative 12  3 - 12 %   Monocytes Absolute 0.9  0.1 - 1.0 K/uL   Eosinophils Relative 3  0 - 5 %   Eosinophils Absolute 0.2  0.0 - 0.7 K/uL   Basophils Relative 0  0 - 1 %   Basophils Absolute 0.0  0.0 - 0.1 K/uL  BASIC METABOLIC PANEL      Component Value Range   Sodium 141  135 - 145 mEq/L   Potassium 3.9  3.5 - 5.1 mEq/L   Chloride 102  96 - 112 mEq/L   CO2 29  19 - 32 mEq/L   Glucose, Bld 124 (*) 70 - 99 mg/dL   BUN 24 (*) 6 - 23 mg/dL   Creatinine, Ser 1.30  0.50 - 1.10 mg/dL   Calcium 9.4  8.4 - 86.5 mg/dL   GFR calc non Af Amer 49 (*) >90 mL/min   GFR calc Af Amer 56 (*) >90 mL/min  PROTIME-INR      Component Value Range   Prothrombin Time 13.1  11.6 - 15.2 seconds   INR 0.97  0.00 - 1.49  CARDIAC PANEL(CRET KIN+CKTOT+MB+TROPI)      Component Value Range   Total CK 111  7 - 177 U/L   CK, MB 3.3  0.3 - 4.0 ng/mL   Troponin I <0.30  <0.30 ng/mL   Relative Index 3.0 (*) 0.0 - 2.5   Dg Chest 1 View  02/11/2012  *RADIOLOGY REPORT*  Clinical Data: Fall with right shoulder pain  CHEST - 1 VIEW  Comparison: Chest x-ray of 06/01/2011  Findings: No active infiltrate  or effusion is seen.  The previously described nodular appearance in the right mid lung appears stable and may be chronic in nature.  Mediastinal contours appear stable. There is moderate cardiomegaly present.  The descending thoracic aorta is ectatic.  The bones are osteopenic.  Comminuted fracture of the right humeral neck is again noted.  IMPRESSION:  1.  Stable cardiomegaly. 2.  No active lung disease.  Stable small nodules in the right mid lung. 3.  Comminuted fracture of the right humeral neck .  Original Report Authenticated By: Juline Patch, M.D.   Dg Shoulder Right  02/11/2012  *RADIOLOGY REPORT*  Clinical Data: Fall with shoulder pain  RIGHT SHOULDER - 2+ VIEW  Comparison: None.  Findings: There is a comminuted fracture of the right humeral neck with  some impaction.  The [facility] joint is normally aligned.  The ribs that are visualized appear normal.  IMPRESSION: Comminuted slightly impacted fracture of the right humeral neck.  Original Report Authenticated By: Juline Patch, M.D.   Ct Head Wo Contrast  02/11/2012  *RADIOLOGY REPORT*  Clinical Data:  Status post fall  CT HEAD WITHOUT CONTRAST CT CERVICAL SPINE WITHOUT CONTRAST  Technique:  Multidetector CT imaging of the head and cervical spine was performed following the standard protocol without intravenous contrast.  Multiplanar CT image reconstructions of the cervical spine were also generated.  Comparison:  02/16/2010  CT HEAD  Findings: There is diffuse patchy low density throughout the subcortical and periventricular white matter consistent with chronic small vessel ischemic change.  There is prominence of the sulci and ventricles consistent with brain atrophy.  There is no evidence for acute brain infarct, hemorrhage or mass.  The mastoid air cells are clear.  The paranasal sinuses are clear. The skull is intact.  IMPRESSION:  1.  Small vessel ischemic change and brain atrophy.  CT CERVICAL SPINE  Findings: Normal alignment of the cervical spine.   The vertebral body heights are well preserved.  There is multilevel disc space narrowing and ventral spurring.  Most severe at C5-6 and C6-7.  The facet joints are all aligned.  No fracture or subluxation identified.  No radio-opaque foreign body or soft tissue calcification.  IMPRESSION:  1.  No fracture or subluxation. 2.  Multilevel cervical spondylosis.  Original Report Authenticated By: Rosealee Albee, M.D.   Ct Cervical Spine Wo Contrast  02/11/2012  *RADIOLOGY REPORT*  Clinical Data:  Status post fall  CT HEAD WITHOUT CONTRAST CT CERVICAL SPINE WITHOUT CONTRAST  Technique:  Multidetector CT imaging of the head and cervical spine was performed following the standard protocol without intravenous contrast.  Multiplanar CT image reconstructions of the cervical spine were also generated.  Comparison:  02/16/2010  CT HEAD  Findings: There is diffuse patchy low density throughout the subcortical and periventricular white matter consistent with chronic small vessel ischemic change.  There is prominence of the sulci and ventricles consistent with brain atrophy.  There is no evidence for acute brain infarct, hemorrhage or mass.  The mastoid air cells are clear.  The paranasal sinuses are clear. The skull is intact.  IMPRESSION:  1.  Small vessel ischemic change and brain atrophy.  CT CERVICAL SPINE  Findings: Normal alignment of the cervical spine.  The vertebral body heights are well preserved.  There is multilevel disc space narrowing and ventral spurring.  Most severe at C5-6 and C6-7.  The facet joints are all aligned.  No fracture or subluxation identified.  No radio-opaque foreign body or soft tissue calcification.  IMPRESSION:  1.  No fracture or subluxation. 2.  Multilevel cervical spondylosis.  Original Report Authenticated By: Rosealee Albee, M.D.   Ct Abdomen Pelvis W Contrast  02/11/2012  *RADIOLOGY REPORT*  Clinical Data: Status post fall  CT ABDOMEN AND PELVIS WITH CONTRAST  Technique:   Multidetector CT imaging of the abdomen and pelvis was performed following the standard protocol during bolus administration of intravenous contrast.  Contrast: OMNIPAQUE IOHEXOL 300 MG/ML  SOLN  Comparison: None.  Findings: The lung bases are clear.  No pericardial or pleural effusion.  The liver is normal and no focal liver abnormalities.  This stone within the lumen of the gallbladder measures 1.2 cm.  No secondary signs of acute cholecystitis.  Normal appearance of the pancreas  the spleen appears normal.  Both adrenal glands are normal.  The kidneys are both normal.  Urinary bladder appears normal.  Tortuous the abdominal aorta is noted.  No enlarged upper abdominal or pelvic adenopathy.  No inguinal adenopathy.  No free fluid or fluid collections identified within the abdomen or pelvis.  The stomach and the small bowel loops are unremarkable. Lipoma is identified within the distal small bowel loop.  This measures 0.9 by 1.2 cm.  The proximal colon is normal.  Multiple sigmoid diverticula identified without acute inflammation.  Postoperative change compatible with prior hysterectomy.  There is moderate degenerative disc disease within the lumbar spine.  First degree anterolisthesis of L4-1 L5 is noted. Left six seventh and eighth rib fractures are suspected but incompletely visualized.  IMPRESSION:  1. Left lateral rib fractures. 2.  No acute findings in the abdomen or pelvis.  3.  Gallstones. 4.  Lumbar spondylosis with a first-degree anterolisthesis of L5 on S1.  Original Report Authenticated By: Rosealee Albee, M.D.   Dg Humerus Right  02/11/2012  *RADIOLOGY REPORT*  Clinical Data: Recent fall with the right proximal humeral pain  RIGHT HUMERUS - 2+ VIEW  Comparison: None  Findings: There is a comminuted fracture of the right humeral neck with some impaction.  The remainder of the humerus is intact.  The [facility] joint appears normal.  IMPRESSION: Comminuted slightly impacted fracture of the right humeral  neck.  Original Report Authenticated By: Juline Patch, M.D.   plan prescription Norco Shoulder immobilizer followup Dr.Yates next week   Doug Sou, MD 02/11/12 1900

## 2012-02-11 NOTE — ED Notes (Signed)
Report given to [facility] at [facility].

## 2012-02-11 NOTE — ED Notes (Signed)
[facility] notified for transportation back to [facility]

## 2012-02-11 NOTE — ED Notes (Signed)
VZD:[id number]<FI> Expected date:02/11/12<BR> Expected time: 1:31 PM<BR> Means of arrival:Ambulance<BR> Comments:<BR> 78yo F, fall

## 2012-11-21 ENCOUNTER — Encounter: Payer: Self-pay | Admitting: Neurology

## 2012-11-21 ENCOUNTER — Ambulatory Visit (INDEPENDENT_AMBULATORY_CARE_PROVIDER_SITE_OTHER): Payer: Medicare Other | Admitting: Neurology

## 2012-11-21 VITALS — BP 125/70 | HR 80 | Wt 134.0 lb

## 2012-11-21 DIAGNOSIS — F0390 Unspecified dementia without behavioral disturbance: Secondary | ICD-10-CM | POA: Insufficient documentation

## 2012-11-21 DIAGNOSIS — R259 Unspecified abnormal involuntary movements: Secondary | ICD-10-CM | POA: Insufficient documentation

## 2012-11-21 MED ORDER — CARBIDOPA-LEVODOPA 25-100 MG PO TABS: 1.0000 | ORAL_TABLET | Freq: Three times a day (TID) | ORAL | Status: AC

## 2012-11-21 NOTE — Progress Notes (Signed)
History: Bonnie Riley is a 79 years old right-handed Caucasian female, accompanied by her daughter, referred by her primary care physician Dr. Smith Mince for evaluation of gait difficulty frequent falling, memory trouble,  She had a past medical history of hypothyroidism on supplement, depression anxiety, history of breast cancer, uterine cancer, status post surgery followed by radiation therapy.  She had 12 years of education, retired as an Print production planner for [employer], she lived alone until about 3 years ago, she was noted by her family to have gradual onset of memory trouble, in the same time, since 2009, she was noted to have gradual onset gait difficulty small shuffling began to leaning forward, she was still independent at that time, driving,  lived at her own house, had worseing depression and anxiety, she began to frequent falling, left wrist fracture in May 2011, right hip fracture November 2012, right shoulder fracture August 2013   She was moved to assisted living, gradual worsening gait difficulty, now ambulate with a walker, worsening memory trouble, she is taking Depakote, Abilify, she can sleep better now, she was also started on Namenda in 2011, no significant side effect .  There was no tremor, she denied lost sense of smell, she denies REM sleep disorder, no orthostatic symptoms, no constipation, she denies hallucinations.  Her brother and sister all suffered memory loss, her mother lived into 32, father suffered severe arthritis   Review of systems: Out of a complete 14 system review, the patient complains of only the following symptoms, and all other reviewed systems are negative.   Constitutional:   Weight loss Cardiovascular:  N/A Ear/Nose/Throat:  N/A Skin: N/A Eyes: N/A Respiratory: N/A Gastroitestinal: Incontinence, constipation    Hematology/Lymphatic:  N/A Endocrine:  N/A Musculoskeletal: Aching muscles Allergy/Immunology: Allergy runny  nose, Neurological: Memory loss , confusion , tremor  Psychiatric:    N/A  PHYSICAL EXAMINATOINS:  Generalized: In no acute distress  Neck: Supple, no carotid bruits   Cardiac: Regular rate rhythm  Pulmonary: Clear to auscultation bilaterally  Musculoskeletal: Limited range of motion of bilateral shoulder joints  Neurological examination  Mentation: fragile, tired looking elderly female, decreased facial expression  Cranial nerve II-XII: Pupils were equal round reactive to light extraocular movements were full, visual field were full on confrontational test. facial sensation and strength were normal. hearing was intact to finger rubbing bilaterally. Uvula tongue midline.  head turning and shoulder shrug and were normal and symmetric.Tongue protrusion into cheek strength was normal.  Motor: limited range of motion of bilateral shoulder joints, no significant upper lower extremity weakness, right more than left moderate limb and nuchal rigidity, early fatigability with rapid wrist opening and closure, foot tapping,  Sensory: pinprick, preserved vibratory sensation, and proprioception at toes.  Coordination: Normal finger to nose, heel-to-shin bilaterally there was no truncal ataxia  Gait: need multiple attempts to get up from seated position, lean backwards, difficulty initiate gait, stoop forward postural, unsteady,   Deep tendon reflexes: Brachioradialis 3/3, biceps 3/3, triceps 3/3, patellar 3/3, Achilles 2/2, plantar responses were flexor bilaterally.  Assessment and plan:    79 years old right-handed Caucasian female, with history of G gradually onset memory trouble, small shuffling gait, frequent falling, on today's examination, she has parkinsonian features, right more than left, Mini-Mental Status Examination 16/30, hyperreflexia   1.need to rule out cervical spondylitic myelopathy, likely central nervous system degenerative disorder with parkinsonian features, Lewy body  dementia, vs. Parkinson's disease with dementia 2. MRI of brain, cervical spine 3  sinemet 25/100 mg 3 times a day  4. physical therapy.

## 2012-11-24 ENCOUNTER — Ambulatory Visit: Payer: Medicare Other | Admitting: Neurology

## 2012-11-24 ENCOUNTER — Other Ambulatory Visit: Payer: Self-pay | Admitting: Neurology

## 2012-11-24 DIAGNOSIS — S72001S Fracture of unspecified part of neck of right femur, sequela: Secondary | ICD-10-CM

## 2012-11-24 DIAGNOSIS — R29818 Other symptoms and signs involving the nervous system: Secondary | ICD-10-CM

## 2012-11-24 DIAGNOSIS — F0390 Unspecified dementia without behavioral disturbance: Secondary | ICD-10-CM

## 2012-11-24 DIAGNOSIS — R259 Unspecified abnormal involuntary movements: Secondary | ICD-10-CM

## 2012-12-08 ENCOUNTER — Ambulatory Visit
Admission: RE | Admit: 2012-12-08 | Discharge: 2012-12-08 | Disposition: A | Discharge status: Home / Self Care | Payer: Medicare Other | Source: Ambulatory Visit | Attending: Neurology | Admitting: Neurology

## 2012-12-08 DIAGNOSIS — F0390 Unspecified dementia without behavioral disturbance: Secondary | ICD-10-CM

## 2012-12-08 DIAGNOSIS — S72001S Fracture of unspecified part of neck of right femur, sequela: Secondary | ICD-10-CM

## 2012-12-08 DIAGNOSIS — R259 Unspecified abnormal involuntary movements: Secondary | ICD-10-CM

## 2012-12-08 DIAGNOSIS — R269 Unspecified abnormalities of gait and mobility: Secondary | ICD-10-CM

## 2012-12-15 NOTE — Progress Notes (Signed)
Quick Note:  Please call patient, MRI of the brain, and cervical spine showed mild age-related changes, will have further discussion upon her followup visit ______

## 2012-12-18 NOTE — Progress Notes (Signed)
Quick Note:  I spoke to patient's daughter and relayed MRI results of brain and cervical spine, per Dr. Terrace Arabia. Further discussion on follow up visit ______

## 2013-02-22 ENCOUNTER — Telehealth: Payer: Self-pay | Admitting: Neurology

## 2013-02-23 NOTE — Telephone Encounter (Signed)
Called pt's daughter Kriste Basque back to advise if pt can keep her apt on 03/20/13 due to Dr. Terrace Arabia doesn't have anything before then or the week of the 25th of August per request. Left msg on both home phone and daughter's home phone. Thanks

## 2013-03-12 ENCOUNTER — Emergency Department (HOSPITAL_COMMUNITY): Payer: Medicare Other

## 2013-03-12 ENCOUNTER — Emergency Department (HOSPITAL_COMMUNITY)
Admission: EM | Admit: 2013-03-12 | Discharge: 2013-03-12 | Disposition: A | Discharge status: Home / Self Care | Payer: Medicare Other | Attending: Emergency Medicine | Admitting: Emergency Medicine

## 2013-03-12 ENCOUNTER — Encounter (HOSPITAL_COMMUNITY): Payer: Self-pay | Admitting: Emergency Medicine

## 2013-03-12 DIAGNOSIS — F3289 Other specified depressive episodes: Secondary | ICD-10-CM | POA: Insufficient documentation

## 2013-03-12 DIAGNOSIS — F329 Major depressive disorder, single episode, unspecified: Secondary | ICD-10-CM | POA: Insufficient documentation

## 2013-03-12 DIAGNOSIS — Z79899 Other long term (current) drug therapy: Secondary | ICD-10-CM | POA: Insufficient documentation

## 2013-03-12 DIAGNOSIS — S46909A Unspecified injury of unspecified muscle, fascia and tendon at shoulder and upper arm level, unspecified arm, initial encounter: Secondary | ICD-10-CM | POA: Insufficient documentation

## 2013-03-12 DIAGNOSIS — W07XXXA Fall from chair, initial encounter: Secondary | ICD-10-CM | POA: Insufficient documentation

## 2013-03-12 DIAGNOSIS — S4980XA Other specified injuries of shoulder and upper arm, unspecified arm, initial encounter: Secondary | ICD-10-CM | POA: Insufficient documentation

## 2013-03-12 DIAGNOSIS — F0390 Unspecified dementia without behavioral disturbance: Secondary | ICD-10-CM | POA: Insufficient documentation

## 2013-03-12 DIAGNOSIS — W19XXXA Unspecified fall, initial encounter: Secondary | ICD-10-CM

## 2013-03-12 DIAGNOSIS — E039 Hypothyroidism, unspecified: Secondary | ICD-10-CM | POA: Insufficient documentation

## 2013-03-12 DIAGNOSIS — Z87440 Personal history of urinary (tract) infections: Secondary | ICD-10-CM | POA: Insufficient documentation

## 2013-03-12 DIAGNOSIS — Y9389 Activity, other specified: Secondary | ICD-10-CM | POA: Insufficient documentation

## 2013-03-12 DIAGNOSIS — K2970 Gastritis, unspecified, without bleeding: Secondary | ICD-10-CM | POA: Insufficient documentation

## 2013-03-12 DIAGNOSIS — G43909 Migraine, unspecified, not intractable, without status migrainosus: Secondary | ICD-10-CM | POA: Insufficient documentation

## 2013-03-12 DIAGNOSIS — M25551 Pain in right hip: Secondary | ICD-10-CM

## 2013-03-12 DIAGNOSIS — Z7982 Long term (current) use of aspirin: Secondary | ICD-10-CM | POA: Insufficient documentation

## 2013-03-12 DIAGNOSIS — Y9210 Unspecified residential institution as the place of occurrence of the external cause: Secondary | ICD-10-CM | POA: Insufficient documentation

## 2013-03-12 DIAGNOSIS — M129 Arthropathy, unspecified: Secondary | ICD-10-CM | POA: Insufficient documentation

## 2013-03-12 DIAGNOSIS — S79919A Unspecified injury of unspecified hip, initial encounter: Secondary | ICD-10-CM | POA: Insufficient documentation

## 2013-03-12 DIAGNOSIS — W010XXA Fall on same level from slipping, tripping and stumbling without subsequent striking against object, initial encounter: Secondary | ICD-10-CM | POA: Insufficient documentation

## 2013-03-12 NOTE — ED Provider Notes (Signed)
CSN: [id number]     Arrival date & time 03/12/13  1028 History   First MD Initiated Contact with Patient 03/12/13 1031     Chief Complaint  Patient presents with  . Fall   (Consider location/radiation/quality/duration/timing/severity/associated sxs/prior Treatment) HPI Comments: Patient with history of dementia, right hip fracture s/p 05/2011 -- presents with complaint of right hip and right shoulder pain after she slipped from a chair onto the carpet at her nursing facility. Level 5 caveat due to dementia. Pt unable to give details of fall. Per NH report, no head or neck injury. No blood thinners.   Patient is a 79 y.o. female presenting with fall. The history is provided by the patient, the EMS personnel and the nursing home.  Fall    Past Medical History  Diagnosis Date  . Dementia   . Altered mental status   . Depression   . Hypothyroidism   . Gastritis   . Arthritis   . Migraines   . History of recurrent UTIs   . Alteration in bowel elimination: incontinence   . Urinary bladder incontinence   . Dementia   . Hematoma     Hx of right shin hematoma   Past Surgical History  Procedure Laterality Date  . Tonsillectomy    . Femur im nail  06/01/2011    Procedure: INTRAMEDULLARY (IM) NAIL FEMORAL;  Surgeon: Eldred Manges;  Location: [facility];  Service: Orthopedics;  Laterality: Right;   Family History  Problem Relation Age of Onset  . Arthritis Father    History  Substance Use Topics  . Smoking status: Never Smoker   . Smokeless tobacco: Never Used  . Alcohol Use: No   OB History   Grav Para Term Preterm Abortions TAB SAB Ect Mult Living                 Review of Systems  Unable to perform ROS: Dementia    Allergies  Review of patient's allergies indicates no known allergies.  Home Medications   Current Outpatient Rx  Name  Route  Sig  Dispense  Refill  . ARIPiprazole (ABILIFY) 2 MG tablet   Oral   Take 2 mg by mouth daily.         Marland Kitchen aspirin 81 MG  chewable tablet   Oral   Chew 81 mg by mouth daily.         . Calcium-Vitamin D (CALTRATE 600 PLUS-VIT D PO)   Oral   Take 600 mg by mouth daily.           . carbidopa-levodopa (SINEMET IR) 25-100 MG per tablet   Oral   Take 1 tablet by mouth 3 (three) times daily.   90 tablet   12   . divalproex (DEPAKOTE SPRINKLE) 125 MG capsule   Oral   Take 250-500 mg by mouth 2 (two) times daily. Take 250 mg in the am and 500 mg in the evening         . donepezil (ARICEPT) 10 MG tablet   Oral   Take 10 mg by mouth at bedtime.           Marland Kitchen ibuprofen (ADVIL,MOTRIN) 200 MG tablet   Oral   Take 400 mg by mouth every 6 (six) hours as needed for pain.         Marland Kitchen levothyroxine (SYNTHROID, LEVOTHROID) 200 MCG tablet   Oral   Take 212.5 mcg by mouth daily before breakfast.         .  levothyroxine (SYNTHROID, LEVOTHROID) 25 MCG tablet   Oral   Take 12.5 mcg by mouth daily before breakfast.         . Memantine HCl ER (NAMENDA XR) 28 MG CP24   Oral   Take 1 capsule by mouth at bedtime.         . mirtazapine (REMERON) 15 MG tablet   Oral   Take 15 mg by mouth at bedtime.           . multivitamin-lutein (OCUVITE-LUTEIN) CAPS   Oral   Take 1 capsule by mouth daily.         . sertraline (ZOLOFT) 100 MG tablet   Oral   Take 100 mg by mouth at bedtime.           . sucralfate (CARAFATE) 1 G tablet   Oral   Take 1 g by mouth 2 (two) times daily.           . traMADol (ULTRAM) 50 MG tablet   Oral   Take 50 mg by mouth at bedtime.         Marland Kitchen HYDROcodone-acetaminophen (NORCO/VICODIN) 5-325 MG per tablet   Oral   Take 1 tablet by mouth every 6 (six) hours as needed. For pain         . loperamide (IMODIUM) 2 MG capsule   Oral   Take 2 mg by mouth 4 (four) times daily as needed. For loose stool         . LORazepam (ATIVAN) 0.5 MG tablet   Oral   Take 0.5 mg by mouth every 12 (twelve) hours as needed. For anxiety or agitation          . sennosides-docusate  sodium (SENOKOT-S) 8.6-50 MG tablet   Oral   Take 2 tablets by mouth daily as needed. For constipation          BP 138/80  Pulse 80  Temp(Src) 98.4 F (36.9 C) (Oral)  Resp 20  SpO2 97% Physical Exam  Nursing note and vitals reviewed. Constitutional: She appears well-developed and well-nourished.  HENT:  Head: Normocephalic and atraumatic.  Eyes: Conjunctivae are normal.  Neck: Normal range of motion. Neck supple.  Cardiovascular: Normal rate and normal heart sounds.   Pulses:      Dorsalis pedis pulses are 2+ on the right side, and 2+ on the left side.       Posterior tibial pulses are 2+ on the right side, and 2+ on the left side.  Pulmonary/Chest: Effort normal and breath sounds normal. No respiratory distress.  Abdominal: Soft. Bowel sounds are normal. There is no tenderness. There is no rebound.  Musculoskeletal: She exhibits tenderness. She exhibits no edema.       Right shoulder: Normal.       Right elbow: Normal.      Right wrist: Normal.       Right hip: She exhibits tenderness. She exhibits normal range of motion and no bony tenderness.       Right knee: Normal.       Cervical back: Normal.       Lumbar back: Normal.       Right upper arm: Normal.       Right forearm: Normal.       Right hand: Normal.  Neurological: She is alert.  Skin: Skin is warm and dry.  Psychiatric: She has a normal mood and affect.    ED Course  Procedures (including critical care time) Labs Review Labs  Reviewed - No data to display Imaging Review Dg Hip Complete Right  03/12/2013   *RADIOLOGY REPORT*  Clinical Data: Status post fall.  Right hip pain.  RIGHT HIP - COMPLETE 2+ VIEW  Comparison: Plain films right hip 06/01/2011.  Findings: Dynamic hip screw short IM nail for fixation of a healed intertrochanteric right femur fracture is identified.  There is no acute fracture.  No dislocation.  Mild degenerative change about the hips noted.  IMPRESSION:  1.  No acute finding. 2.  Healed  right intertrochanteric fracture with fixation hardware in place. 3.  Mild bilateral hip degenerative disease.   Original Report Authenticated By: Holley Dexter, M.D.    10:55 AM Patient seen and examined. Work-up initiated. Pt has minimal tenderness in hip and is moving R arm and shoulder well with no apparent pain. Will image hip only.   Vital signs reviewed and are as follows: Filed Vitals:   03/12/13 1035  BP: 138/80  Pulse: 80  Temp: 98.4 F (36.9 C)  Resp: 20   X-ray negative.  Patient discussed with and seen by Dr. Fayrene Fearing.  Discharge to home with conservative management.   MDM   1. Hip pain, acute, right   2. Fall, initial encounter    Patient with a minor fall while sitting in a chair. Full range of motion of extremities. Hip imaging is negative for acute fracture. Hardware in place. Patient at baseline. Will discharge. No concern for head or neck injury.    Renne Crigler, PA-C 03/12/13 1529

## 2013-03-12 NOTE — ED Provider Notes (Signed)
I saw examined the patient. Agree with the above assessment. Full range of motion of the hip. Clinically no fracture. Ground-level fall by history. Negative x-rays.  Claudean Kinds, MD 03/12/13 [phone]

## 2013-03-12 NOTE — ED Notes (Signed)
Bed: WA19 Expected date:  Expected time:  Means of arrival:  Comments: ems fall  

## 2013-03-12 NOTE — ED Notes (Signed)
Per EMS: From [facility], pt had witnessed fall out of chair onto carpet. C/o right hip and shoulder pain.

## 2013-03-15 NOTE — ED Provider Notes (Signed)
Medical screening examination/treatment/procedure(s) were conducted as a shared visit with non-physician practitioner(s) and myself.  I personally evaluated the patient during the encounter  Range of motion of the hip and shoulder. No abnormalities on imaging studies. Patient is unable to offer any details of her history she is appropriate for discharge   Claudean Kinds, MD 03/15/13 [phone]

## 2013-03-17 ENCOUNTER — Emergency Department (HOSPITAL_COMMUNITY): Payer: Medicare Other

## 2013-03-17 ENCOUNTER — Inpatient Hospital Stay (HOSPITAL_COMMUNITY)
Admission: EM | Admit: 2013-03-17 | Discharge: 2013-03-20 | DRG: 481 | Disposition: A | Discharge status: Skilled Nursing Facility | Payer: Medicare Other | Attending: Internal Medicine | Admitting: Internal Medicine

## 2013-03-17 ENCOUNTER — Encounter (HOSPITAL_COMMUNITY): Payer: Self-pay | Admitting: *Deleted

## 2013-03-17 DIAGNOSIS — Z87440 Personal history of urinary (tract) infections: Secondary | ICD-10-CM

## 2013-03-17 DIAGNOSIS — Z66 Do not resuscitate: Secondary | ICD-10-CM | POA: Diagnosis present

## 2013-03-17 DIAGNOSIS — D62 Acute posthemorrhagic anemia: Secondary | ICD-10-CM

## 2013-03-17 DIAGNOSIS — W050XXA Fall from non-moving wheelchair, initial encounter: Secondary | ICD-10-CM | POA: Diagnosis present

## 2013-03-17 DIAGNOSIS — E039 Hypothyroidism, unspecified: Secondary | ICD-10-CM | POA: Diagnosis present

## 2013-03-17 DIAGNOSIS — R259 Unspecified abnormal involuntary movements: Secondary | ICD-10-CM

## 2013-03-17 DIAGNOSIS — F411 Generalized anxiety disorder: Secondary | ICD-10-CM | POA: Diagnosis present

## 2013-03-17 DIAGNOSIS — Z79899 Other long term (current) drug therapy: Secondary | ICD-10-CM

## 2013-03-17 DIAGNOSIS — F329 Major depressive disorder, single episode, unspecified: Secondary | ICD-10-CM | POA: Diagnosis present

## 2013-03-17 DIAGNOSIS — W19XXXA Unspecified fall, initial encounter: Secondary | ICD-10-CM

## 2013-03-17 DIAGNOSIS — Y9210 Unspecified residential institution as the place of occurrence of the external cause: Secondary | ICD-10-CM | POA: Diagnosis present

## 2013-03-17 DIAGNOSIS — W19XXXD Unspecified fall, subsequent encounter: Secondary | ICD-10-CM

## 2013-03-17 DIAGNOSIS — F0390 Unspecified dementia without behavioral disturbance: Secondary | ICD-10-CM

## 2013-03-17 DIAGNOSIS — R29818 Other symptoms and signs involving the nervous system: Secondary | ICD-10-CM

## 2013-03-17 DIAGNOSIS — R159 Full incontinence of feces: Secondary | ICD-10-CM | POA: Diagnosis present

## 2013-03-17 DIAGNOSIS — F3289 Other specified depressive episodes: Secondary | ICD-10-CM | POA: Diagnosis present

## 2013-03-17 DIAGNOSIS — R32 Unspecified urinary incontinence: Secondary | ICD-10-CM

## 2013-03-17 DIAGNOSIS — S72009A Fracture of unspecified part of neck of unspecified femur, initial encounter for closed fracture: Secondary | ICD-10-CM

## 2013-03-17 DIAGNOSIS — D696 Thrombocytopenia, unspecified: Secondary | ICD-10-CM | POA: Diagnosis not present

## 2013-03-17 DIAGNOSIS — F0391 Unspecified dementia with behavioral disturbance: Secondary | ICD-10-CM | POA: Diagnosis present

## 2013-03-17 DIAGNOSIS — F03918 Unspecified dementia, unspecified severity, with other behavioral disturbance: Secondary | ICD-10-CM | POA: Diagnosis present

## 2013-03-17 DIAGNOSIS — S72143A Displaced intertrochanteric fracture of unspecified femur, initial encounter for closed fracture: Principal | ICD-10-CM | POA: Diagnosis present

## 2013-03-17 DIAGNOSIS — S72002D Fracture of unspecified part of neck of left femur, subsequent encounter for closed fracture with routine healing: Secondary | ICD-10-CM

## 2013-03-17 DIAGNOSIS — S72002A Fracture of unspecified part of neck of left femur, initial encounter for closed fracture: Secondary | ICD-10-CM

## 2013-03-17 LAB — BASIC METABOLIC PANEL
BUN: 20 mg/dL (ref 6–23)
CO2: 30 mEq/L (ref 19–32)
Calcium: 9.4 mg/dL (ref 8.4–10.5)
GFR calc non Af Amer: 54 mL/min — ABNORMAL LOW (ref >90)
Glucose, Bld: 90 mg/dL (ref 70–99)
Sodium: 139 mEq/L (ref 135–145)

## 2013-03-17 LAB — CBC WITH DIFFERENTIAL/PLATELET
Eosinophils Absolute: 0.3 10*3/uL (ref 0.0–0.7)
Eosinophils Relative: 4 % (ref 0–5)
HCT: 40.8 % (ref 36.0–46.0)
Hemoglobin: 13.2 g/dL (ref 12.0–15.0)
Lymphocytes Relative: 21 % (ref 12–46)
Lymphs Abs: 1.5 10*3/uL (ref 0.7–4.0)
MCH: 30.7 pg (ref 26.0–34.0)
MCV: 94.9 fL (ref 78.0–100.0)
Monocytes Relative: 13 % — ABNORMAL HIGH (ref 3–12)
RBC: 4.3 MIL/uL (ref 3.87–5.11)
WBC: 7.3 10*3/uL (ref 4.0–10.5)

## 2013-03-17 LAB — URINALYSIS, ROUTINE W REFLEX MICROSCOPIC
Bilirubin Urine: NEGATIVE
Glucose, UA: NEGATIVE mg/dL
Ketones, ur: 15 mg/dL — AB
Leukocytes, UA: NEGATIVE
Nitrite: NEGATIVE
Specific Gravity, Urine: 1.028 (ref 1.005–1.030)
pH: 6 (ref 5.0–8.0)

## 2013-03-17 LAB — PROTIME-INR: INR: 1.01 (ref 0.00–1.49)

## 2013-03-17 MED ORDER — DIVALPROEX SODIUM 125 MG PO CPSP
250.0000 mg | ORAL_CAPSULE | Freq: Every day | ORAL | Status: DC
  Administered 2013-03-19 – 2013-03-20 (×2): 250 mg via ORAL
  Filled 2013-03-17 (×3): qty 2

## 2013-03-17 MED ORDER — ACETAMINOPHEN 650 MG RE SUPP: 650.0000 mg | Freq: Four times a day (QID) | RECTAL | Status: DC | PRN

## 2013-03-17 MED ORDER — DONEPEZIL HCL 23 MG PO TABS
23.0000 mg | ORAL_TABLET | Freq: Every day | ORAL | Status: DC
  Administered 2013-03-17 – 2013-03-19 (×3): 23 mg via ORAL
  Filled 2013-03-17 (×4): qty 1

## 2013-03-17 MED ORDER — ALBUTEROL SULFATE (5 MG/ML) 0.5% IN NEBU: 2.5000 mg | INHALATION_SOLUTION | RESPIRATORY_TRACT | Status: DC | PRN

## 2013-03-17 MED ORDER — ASPIRIN 81 MG PO CHEW
81.0000 mg | CHEWABLE_TABLET | Freq: Every day | ORAL | Status: DC
  Filled 2013-03-17: qty 1

## 2013-03-17 MED ORDER — TRAMADOL HCL 50 MG PO TABS
50.0000 mg | ORAL_TABLET | Freq: Every day | ORAL | Status: DC
  Administered 2013-03-17 – 2013-03-18 (×2): 50 mg via ORAL
  Filled 2013-03-17 (×2): qty 1

## 2013-03-17 MED ORDER — SERTRALINE HCL 100 MG PO TABS
100.0000 mg | ORAL_TABLET | Freq: Every day | ORAL | Status: DC
  Administered 2013-03-17 – 2013-03-19 (×3): 100 mg via ORAL
  Filled 2013-03-17 (×4): qty 1

## 2013-03-17 MED ORDER — HYDROCODONE-ACETAMINOPHEN 5-325 MG PO TABS
1.0000 | ORAL_TABLET | Freq: Four times a day (QID) | ORAL | Status: DC | PRN
  Administered 2013-03-18 – 2013-03-20 (×4): 1 via ORAL
  Filled 2013-03-17 (×4): qty 1

## 2013-03-17 MED ORDER — MEMANTINE HCL 10 MG PO TABS
10.0000 mg | ORAL_TABLET | Freq: Two times a day (BID) | ORAL | Status: DC
  Administered 2013-03-17 – 2013-03-20 (×5): 10 mg via ORAL
  Filled 2013-03-17 (×7): qty 1

## 2013-03-17 MED ORDER — HYDROMORPHONE HCL PF 1 MG/ML IJ SOLN
0.5000 mg | Freq: Four times a day (QID) | INTRAMUSCULAR | Status: DC | PRN
  Administered 2013-03-17 (×2): 0.5 mg via INTRAVENOUS
  Filled 2013-03-17 (×2): qty 1

## 2013-03-17 MED ORDER — DIVALPROEX SODIUM 125 MG PO CPSP
500.0000 mg | ORAL_CAPSULE | Freq: Every day | ORAL | Status: DC
  Administered 2013-03-17 – 2013-03-19 (×3): 500 mg via ORAL
  Filled 2013-03-17 (×4): qty 4

## 2013-03-17 MED ORDER — SUCRALFATE 1 G PO TABS
1.0000 g | ORAL_TABLET | Freq: Two times a day (BID) | ORAL | Status: DC
  Administered 2013-03-17 – 2013-03-20 (×5): 1 g via ORAL
  Filled 2013-03-17 (×7): qty 1

## 2013-03-17 MED ORDER — LORAZEPAM 0.5 MG PO TABS
0.5000 mg | ORAL_TABLET | Freq: Three times a day (TID) | ORAL | Status: DC | PRN
  Administered 2013-03-18: 0.5 mg via ORAL
  Filled 2013-03-17: qty 1

## 2013-03-17 MED ORDER — SODIUM CHLORIDE 0.9 % IV SOLN
INTRAVENOUS | Status: DC
  Administered 2013-03-17 – 2013-03-20 (×6): via INTRAVENOUS

## 2013-03-17 MED ORDER — HYDROMORPHONE HCL PF 1 MG/ML IJ SOLN
0.5000 mg | INTRAMUSCULAR | Status: DC | PRN
  Administered 2013-03-18 (×4): 0.5 mg via INTRAVENOUS
  Filled 2013-03-17 (×4): qty 1

## 2013-03-17 MED ORDER — MIRTAZAPINE 15 MG PO TABS
15.0000 mg | ORAL_TABLET | Freq: Every day | ORAL | Status: DC
  Administered 2013-03-17 – 2013-03-19 (×3): 15 mg via ORAL
  Filled 2013-03-17 (×4): qty 1

## 2013-03-17 MED ORDER — LEVOTHYROXINE SODIUM 25 MCG PO TABS: 12.5000 ug | ORAL_TABLET | Freq: Every day | ORAL | Status: DC

## 2013-03-17 MED ORDER — LEVOTHYROXINE SODIUM 25 MCG PO TABS
212.5000 ug | ORAL_TABLET | Freq: Every day | ORAL | Status: DC
  Administered 2013-03-19 – 2013-03-20 (×2): 212.5 ug via ORAL
  Filled 2013-03-17 (×5): qty 0.5

## 2013-03-17 MED ORDER — CARBIDOPA-LEVODOPA 25-100 MG PO TABS
1.0000 | ORAL_TABLET | Freq: Three times a day (TID) | ORAL | Status: DC
  Administered 2013-03-17 – 2013-03-20 (×7): 1 via ORAL
  Filled 2013-03-17 (×10): qty 1

## 2013-03-17 MED ORDER — ONDANSETRON HCL 4 MG PO TABS: 4.0000 mg | ORAL_TABLET | Freq: Four times a day (QID) | ORAL | Status: DC | PRN

## 2013-03-17 MED ORDER — MEMANTINE HCL ER 28 MG PO CP24: 1.0000 | ORAL_CAPSULE | Freq: Every day | ORAL | Status: DC

## 2013-03-17 MED ORDER — ONDANSETRON HCL 4 MG/2ML IJ SOLN: 4.0000 mg | Freq: Four times a day (QID) | INTRAMUSCULAR | Status: DC | PRN

## 2013-03-17 MED ORDER — ACETAMINOPHEN 325 MG PO TABS: 650.0000 mg | ORAL_TABLET | Freq: Four times a day (QID) | ORAL | Status: DC | PRN

## 2013-03-17 MED ORDER — HEPARIN SODIUM (PORCINE) 5000 UNIT/ML IJ SOLN
5000.0000 [IU] | Freq: Three times a day (TID) | INTRAMUSCULAR | Status: DC
  Administered 2013-03-17 – 2013-03-18 (×2): 5000 [IU] via SUBCUTANEOUS
  Filled 2013-03-17 (×5): qty 1

## 2013-03-17 MED ORDER — ARIPIPRAZOLE 2 MG PO TABS
2.0000 mg | ORAL_TABLET | Freq: Every day | ORAL | Status: DC
  Administered 2013-03-19 – 2013-03-20 (×2): 2 mg via ORAL
  Filled 2013-03-17 (×3): qty 1

## 2013-03-17 MED ORDER — DOCUSATE SODIUM 100 MG PO CAPS
100.0000 mg | ORAL_CAPSULE | Freq: Two times a day (BID) | ORAL | Status: DC
  Administered 2013-03-17 – 2013-03-20 (×5): 100 mg via ORAL
  Filled 2013-03-17 (×7): qty 1

## 2013-03-17 NOTE — H&P (Signed)
[facility] History and Physical  Bonnie Riley ZOX:[id number] DOB: [birth year] DOA: 03/17/2013  Referring physician: Dr. Ronna Polio, EDP PCP: MAZZOCCHI, Rise Mu, MD  Outpatient Specialists:  1. Psychiatry: Dr. Archer Asa  Chief Complaint: Left hip pain/hip fracture  HPI: [name] is a 79 y.o. female, a resident of [address] assisted living facility, with a past medical history of moderate dementia associated with behavioral abnormalities (agitation, anxiety and aggression), recurrent UTIs, hypothyroidism, bowel and bladder incontinence, depression, unsteady gait and falls, presented to the ED on 03/17/13 following a fall at the nursing facility today associated with left pain. Patient is unable to provide history secondary to chronic altered mental status. History was obtained from patient's daughter/health care power of attorney Ms. Janace Hoard and EDP. Patient was seen in the ED on 03/12/13 for a fall. She usually ambulates with the help of a walker but requires assistance for most activities of daily living. Since yesterday she has been on the wheelchair. She has to press a button to alert the staff when she needs assistance to move around. She tried to get up of her wheelchair by herself today and fell on her left side. This was associated with excruciating left hip area pain and the patient was screaming. No reported history of head injury or loss of consciousness. Per daughter, no other significant complaints prior to today's fall. No history of dysuria, urinary frequency, worsening mental status changes, fever or chills. She is incontinent of urine and stools. In the ED, x-rays revealed displaced left intertrochanteric hip fracture. EDP has consulted orthopedics and hospitalist admission has been requested.   Review of Systems: All systems reviewed with daughter and apart from history of presenting illness, are negative.  Past Medical History  Diagnosis Date   . Dementia   . Altered mental status   . Depression   . Hypothyroidism   . Gastritis   . Arthritis   . Migraines   . History of recurrent UTIs   . Alteration in bowel elimination: incontinence   . Urinary bladder incontinence   . Dementia   . Hematoma     Hx of right shin hematoma   Past Surgical History  Procedure Laterality Date  . Tonsillectomy    . Femur im nail  06/01/2011    Procedure: INTRAMEDULLARY (IM) NAIL FEMORAL;  Surgeon: Eldred Manges;  Location: [facility];  Service: Orthopedics;  Laterality: Right;   Social History:  reports that she has never smoked. She has never used smokeless tobacco. She reports that she does not drink alcohol or use illicit drugs. Widowed. Resident of assisted-living facility. Usually ambulates with the help of a walker. Requires assistance with activities of daily living. Incontinent of urine and stools.  No Known Allergies  Family History  Problem Relation Age of Onset  . Arthritis Father     Prior to Admission medications   Medication Sig Start Date End Date Taking? Authorizing Provider  ARIPiprazole (ABILIFY) 2 MG tablet Take 2 mg by mouth daily.   Yes Historical Provider, MD  aspirin 81 MG chewable tablet Chew 81 mg by mouth daily.   Yes Historical Provider, MD  Calcium-Vitamin D (CALTRATE 600 PLUS-VIT D PO) Take 600 mg by mouth daily.     Yes Historical Provider, MD  carbidopa-levodopa (SINEMET IR) 25-100 MG per tablet Take 1 tablet by mouth 3 (three) times daily. 11/21/12  Yes Levert Feinstein, MD  divalproex (DEPAKOTE SPRINKLE) 125 MG capsule Take 250-500 mg by mouth 2 (two) times daily.  Take 250 mg in the am and 500 mg in the evening   Yes Historical Provider, MD  donepezil (ARICEPT) 23 MG TABS tablet Take 23 mg by mouth at bedtime.   Yes Historical Provider, MD  HYDROcodone-acetaminophen (NORCO/VICODIN) 5-325 MG per tablet Take 1 tablet by mouth every 6 (six) hours as needed. For pain   Yes Historical Provider, MD  ibuprofen (ADVIL,MOTRIN)  200 MG tablet Take 400 mg by mouth every 6 (six) hours as needed for pain.   Yes Historical Provider, MD  levothyroxine (SYNTHROID, LEVOTHROID) 200 MCG tablet Take 212.5 mcg by mouth daily before breakfast.   Yes Historical Provider, MD  levothyroxine (SYNTHROID, LEVOTHROID) 25 MCG tablet Take 12.5 mcg by mouth daily before breakfast.   Yes Historical Provider, MD  loperamide (IMODIUM) 2 MG capsule Take 2 mg by mouth 4 (four) times daily as needed. For loose stool   Yes Historical Provider, MD  LORazepam (ATIVAN) 0.5 MG tablet Take 0.5 mg by mouth every 8 (eight) hours as needed for anxiety (agitation). For anxiety or agitation   Yes Historical Provider, MD  Memantine HCl ER (NAMENDA XR) 28 MG CP24 Take 1 capsule by mouth at bedtime.   Yes Historical Provider, MD  mirtazapine (REMERON) 15 MG tablet Take 15 mg by mouth at bedtime.     Yes Historical Provider, MD  multivitamin-lutein (OCUVITE-LUTEIN) CAPS Take 1 capsule by mouth daily.   Yes Historical Provider, MD  sennosides-docusate sodium (SENOKOT-S) 8.6-50 MG tablet Take 2 tablets by mouth daily as needed. For constipation   Yes Historical Provider, MD  sertraline (ZOLOFT) 100 MG tablet Take 100 mg by mouth at bedtime.     Yes Historical Provider, MD  sucralfate (CARAFATE) 1 G tablet Take 1 g by mouth 2 (two) times daily.     Yes Historical Provider, MD  traMADol (ULTRAM) 50 MG tablet Take 50 mg by mouth at bedtime.   Yes Historical Provider, MD   Physical Exam: Filed Vitals:   03/17/13 1401 03/17/13 1418  BP:  149/81  Pulse:  75  Temp:  97.6 F (36.4 C)  TempSrc:  Oral  Resp:  20  SpO2: 97% 94%     General exam: Moderately built and nourished female patient, lying comfortably supine on the gurney in no obvious distress.  Head, eyes and ENT: Nontraumatic and normocephalic. Pupils equally reacting to light and accommodation. Oral mucosa mildly dry.  Neck: Supple. No JVD, carotid bruit or thyromegaly.  Lymphatics: No  lymphadenopathy.  Respiratory system: Poor inspiratory effort but seems clear to auscultation. No increased work of breathing.  Cardiovascular system: S1 and S2 heard, RRR. No JVD, murmurs, gallops, clicks or pedal edema.  Gastrointestinal system: Abdomen is nondistended, soft and nontender. Normal bowel sounds heard. No organomegaly or masses appreciated.  Central nervous system: Alert and oriented x2. No focal neurological deficits.  Extremities: Symmetric 5 x 5 power in bilateral upper extremities and right lower extremity. Left lower extremity is shortened and externally rotated. Movements are restricted secondary to left hip pain. Peripheral pulses symmetrically felt.  Skin: No rashes or acute findings.  Musculoskeletal system: Negative exam.  Psychiatry: Pleasant and cooperative.   Labs on Admission:  Basic Metabolic Panel:  Recent Labs Lab 03/17/13 1422  NA 139  K 4.0  CL 102  CO2 30  GLUCOSE 90  BUN 20  CREATININE 0.94  CALCIUM 9.4   Liver Function Tests: No results found for this basename: AST, ALT, ALKPHOS, BILITOT, PROT, ALBUMIN,  in the  last 168 hours No results found for this basename: LIPASE, AMYLASE,  in the last 168 hours No results found for this basename: AMMONIA,  in the last 168 hours CBC:  Recent Labs Lab 03/17/13 1422  WBC 7.3  NEUTROABS 4.5  HGB 13.2  HCT 40.8  MCV 94.9  PLT 181   Cardiac Enzymes: No results found for this basename: CKTOTAL, CKMB, CKMBINDEX, TROPONINI,  in the last 168 hours  BNP (last 3 results) No results found for this basename: PROBNP,  in the last 8760 hours CBG: No results found for this basename: GLUCAP,  in the last 168 hours  Radiological Exams on Admission: Dg Chest 1 View  03/17/2013   *RADIOLOGY REPORT*  Clinical Data: Preoperative examination (weakness, fall from wheelchair  CHEST - 1 VIEW  Comparison: 02/11/2012; 06/01/2011; Thoracic spine radiographs - earlier same day  Findings: Grossly unchanged  enlarged cardiac silhouette and mediastinal contours with tortuosity and ectasia of the descending thoracic aorta.  The lungs appear hyperexpanded with flattening of bilateral main diaphragms mild diffuse slightly nodular thickening of the interstitium. Nodular opacities overlying the right mid lung are grossly unchanged.  No new focal airspace opacities.  No pleural effusion or pneumothorax.  No evidence of edema.  No acute osseous abnormality.  IMPRESSION: 1.  Hyperexpanded lungs without acute cardiopulmonary disease. 2.  Nodular opacities overlying the right mid lung are grossly unchanged since the 2012 examination and thus favored to be a benign etiology.   Original Report Authenticated By: Tacey Ruiz, MD   Dg Thoracic Spine 2 View  03/17/2013   *RADIOLOGY REPORT*  Clinical Data: Fall from wheelchair with back pain.  Left hip pain.  THORACIC SPINE - 2 VIEW  Comparison: Chest film of 02/11/2012  Findings: AP view demonstrates mild convex left thoracic spine curvature.  Paraspinous contours grossly unremarkable.  Apparent right sided mediastinal soft tissue fullness is favored to be projectional and is less impressive on the dedicated chest radiograph.  2 lateral views.  These demonstrate mild loss of the upper and mid thoracic vertebral body height at 3 levels.  Felt to be grossly similar to 06/01/2011 lateral chest radiograph.  The lower thoracic spine is not well evaluated on the first lateral view.  IMPRESSION: Mild thoracic compression deformities, felt to be similar to 06/01/2011.  Degradation, especially effecting the lower thoracic spine on the lateral view.   Original Report Authenticated By: Jeronimo Greaves, M.D.   Dg Lumbar Spine 2-3 Views  03/17/2013   *RADIOLOGY REPORT*  Clinical Data: Fall from wheelchair with back pain and left hip pain.  LUMBAR SPINE - 2-3 VIEW  Comparison: CT abdomen/pelvis 02/11/2012  Findings: There is diffuse decreased bone density.  There is spondylosis throughout the lumbar  spine.  There is moderate disc space narrowing at the L4-5 and L5-L1 levels.  There is no change and mild grade 1 anterolisthesis of L5 on S1.  There is disc space narrowing at the T12-L1 level.  Facet arthropathy is present over the lower lumbar spine.  Orthopedic screw is seen over the right proximal femur. There is a suggestion of a displaced intertrochanteric fracture of the left hip incompletely visualized on this exam.  IMPRESSION: Spondylosis of the spine with multilevel degenerative disc disease and mild grade 1 anterolisthesis of L5 on S1 unchanged.  No acute compression fracture.  Suggestion of a displaced left intertrochanteric hip fracture incompletely visualized on this exam.  Recommend clinical correlation and left hip films for further evaluation.   Original  Report Authenticated By: Elberta Fortis, M.D.   Dg Hip Complete Left  03/17/2013   *RADIOLOGY REPORT*  Clinical Data: Fall from wheelchair.  LEFT HIP - COMPLETE 2+ VIEW  Comparison: None.  Findings: Examination demonstrates a displaced intertrochanteric fracture of the left hip.  The left lesser trochanter is displaced. There is mild symmetric degenerative change of the hips.  An intramedullary nail is present over the right femur with associated screw bridging the right femoral neck into the femoral head intact. There are two small metallic densities of the right pelvis.  IMPRESSION: Displaced left intertrochanteric hip fracture.   Original Report Authenticated By: Elberta Fortis, M.D.   Ct Head Wo Contrast  03/17/2013   *RADIOLOGY REPORT*  Clinical Data:  History of trauma from a fall.  CT HEAD WITHOUT CONTRAST CT CERVICAL SPINE WITHOUT CONTRAST  Technique:  Multidetector CT imaging of the head and cervical spine was performed following the standard protocol without intravenous contrast.  Multiplanar CT image reconstructions of the cervical spine were also generated.  Comparison:  Head CT and cervical spine CT 02/11/2012.  CT HEAD  Findings:  Mild cerebral and cerebellar atrophy.  Extensive patchy and confluent areas of decreased attenuation throughout the deep and periventricular white matter of the cerebral hemispheres bilaterally, compatible with chronic microvascular ischemic disease.  A well-defined focus of low attenuation in the anterior limb of the left internal capsule is unchanged, compatible with an old lacunar infarction. No acute displaced skull fractures are identified.  No acute intracranial abnormality.  Specifically, no evidence of acute post-traumatic intracranial hemorrhage, no definite regions of acute/subacute cerebral ischemia, no focal mass, mass effect, hydrocephalus or abnormal intra or extra-axial fluid collections.  The visualized paranasal sinuses and mastoids are well pneumatized.  IMPRESSION: 1.  No acute displaced skull fracture or evidence to suggest significant acute traumatic injury to the brain. 2.  Mild cerebral and cerebellar atrophy with extensive chronic microvascular ischemic changes and old lacunar infarction in the anterior limb of the left internal capsule, as above.  CT CERVICAL SPINE  Findings: No acute displaced fractures of the cervical spine. Alignment is anatomic.  Prevertebral soft tissues are normal. Multilevel degenerative disc disease, most severe at C2-C3 and C5- C6.  Mild multilevel facet arthropathy.  Visualized portions of the lung apices are remarkable for bilateral apical pleuroparenchymal thickening, most compatible with scarring related to prior episodes of infection or inflammation.  IMPRESSION: 1.  No evidence of significant acute traumatic injury to the cervical spine. 2.  Multilevel degenerative disc disease and cervical spondylosis, as above.   Original Report Authenticated By: Trudie Reed, M.D.   Ct Cervical Spine Wo Contrast  03/17/2013   *RADIOLOGY REPORT*  Clinical Data:  History of trauma from a fall.  CT HEAD WITHOUT CONTRAST CT CERVICAL SPINE WITHOUT CONTRAST  Technique:   Multidetector CT imaging of the head and cervical spine was performed following the standard protocol without intravenous contrast.  Multiplanar CT image reconstructions of the cervical spine were also generated.  Comparison:  Head CT and cervical spine CT 02/11/2012.  CT HEAD  Findings: Mild cerebral and cerebellar atrophy.  Extensive patchy and confluent areas of decreased attenuation throughout the deep and periventricular white matter of the cerebral hemispheres bilaterally, compatible with chronic microvascular ischemic disease.  A well-defined focus of low attenuation in the anterior limb of the left internal capsule is unchanged, compatible with an old lacunar infarction. No acute displaced skull fractures are identified.  No acute intracranial abnormality.  Specifically, no  evidence of acute post-traumatic intracranial hemorrhage, no definite regions of acute/subacute cerebral ischemia, no focal mass, mass effect, hydrocephalus or abnormal intra or extra-axial fluid collections.  The visualized paranasal sinuses and mastoids are well pneumatized.  IMPRESSION: 1.  No acute displaced skull fracture or evidence to suggest significant acute traumatic injury to the brain. 2.  Mild cerebral and cerebellar atrophy with extensive chronic microvascular ischemic changes and old lacunar infarction in the anterior limb of the left internal capsule, as above.  CT CERVICAL SPINE  Findings: No acute displaced fractures of the cervical spine. Alignment is anatomic.  Prevertebral soft tissues are normal. Multilevel degenerative disc disease, most severe at C2-C3 and C5- C6.  Mild multilevel facet arthropathy.  Visualized portions of the lung apices are remarkable for bilateral apical pleuroparenchymal thickening, most compatible with scarring related to prior episodes of infection or inflammation.  IMPRESSION: 1.  No evidence of significant acute traumatic injury to the cervical spine. 2.  Multilevel degenerative disc  disease and cervical spondylosis, as above.   Original Report Authenticated By: Trudie Reed, M.D.   Dg Femur Left Port  03/17/2013   *RADIOLOGY REPORT*  Clinical Data: History of fall complaining of left leg pain.  PORTABLE LEFT FEMUR - 2 VIEW  Comparison: No priors.  Findings: AP and lateral views of the left femur are limited by lack of visualization of the proximal femur (see separate dictation for left hip which demonstrates an intertrochanteric fracture). The mid and distal third of the left femur appear intact without acute displaced fracture.  IMPRESSION: 1.  No acute displaced fracture of the mid or distal third of the left femur.  Intertrochanteric fracture of the left hip was not imaged, see report for a left hip radiograph 03/17/2013 for full description.   Original Report Authenticated By: Trudie Reed, M.D.   Dg Knee Left Port  03/17/2013   *RADIOLOGY REPORT*  Clinical Data: History of fall.  Left knee pain.  PORTABLE LEFT KNEE - 1-2 VIEW  Comparison: No priors.  Findings: Two views of the left knee demonstrate no acute displaced fracture, subluxation or dislocation.  There is joint space narrowing, subchondral sclerosis, subchondral cyst formation and osteophyte formation, most pronounced in the patellofemoral compartment, compatible with osteoarthritis.  IMPRESSION: 1.  No acute radiographic abnormality of the left knee. 2.  Tricompartmental osteoarthritis, most severe in the patellofemoral compartment.   Original Report Authenticated By: Trudie Reed, M.D.    EKG: Independently reviewed. Sinus rhythm at 71 beats per minute, normal axis and no acute abnormalities.   Assessment/Plan Principal Problem:   Hip fracture, left Active Problems:   Dementia   Parkinsonian features   Fall at nursing home   Hypothyroidism   History of recurrent UTIs   Urinary bladder incontinence   Alteration in bowel elimination: incontinence   Displaced intertrochanteric left hip fracture -  Sustained after a mechanical fall. - Orthopedics have been consulted by EDP and their input is pending. - Patient has no cardiac or pulmonary history. She is mild to moderate risk for perioperative CV events from advanced age, overall frail health. She is cleared for surgery without any further CV workup. - Place Foley catheter secondary to bladder and bowel incontinence history - Followup urine microscopy. - Currently made n.p.o. for possible surgery, bedrest and heparin for DVT prophylaxis. Further activity status and DVT prophylaxis to be addressed by the surgeons.  Moderate dementia with behavioral changes (agitation and anxiety)/depression - Continue PTA medications and monitor closely. - Fall precautions.  Hypothyroidism - Continue home dose of Synthroid.  History of recurrent UTI's - Follow urine microscopy  History of unsteady gait and falls - This is her second fall within a week. She will likely need higher level of care/SNF at discharge.   Right midlung nodules on chest x-ray  - Lifelong nonsmoker - Said to be stable compared to prior comparison - Outpatient follow up    Code Status: DO NOT RESUSCITATE   Family Communication: Discussed with patients daughter/health care power of attorney Ms. Becky Opacham  Disposition Plan: Possibly SNF at discharge    Time spent: 65 minutes   [facility] Pager [phone]  If 7PM-7AM, please contact night-coverage [email or web address] Password [email or web address] 03/17/2013, 5:14 PM

## 2013-03-17 NOTE — ED Notes (Signed)
Bed: WA01 Expected date: 03/17/13 Expected time: 1:48 PM Means of arrival: Ambulance Comments: 79 yo fall Hip pain

## 2013-03-17 NOTE — ED Provider Notes (Signed)
CSN: [id number]     Arrival date & time 03/17/13  1400 History   First MD Initiated Contact with Patient 03/17/13 1403     Chief Complaint  Patient presents with  . Fall     left hip pain   (Consider location/radiation/quality/duration/timing/severity/associated sxs/prior Treatment) HPI Comments: Patient sent to the ER for evaluation after a fall. She presents from skilled nursing facility by EMS. Patient was attempting to get out of her wheelchair, slipped and fell to the ground. 2 had on her left side and has been complaining of left hip pain since the fall. No loss of consciousness. Patient administered fentanyl 25 mcg IV by EMS prior to arrival. Arrival, patient does not answer questions appropriately. She has chronic dementia. Level V Caveat due to dementia.  Patient is a 79 y.o. female presenting with fall.  Fall    Past Medical History  Diagnosis Date  . Dementia   . Altered mental status   . Depression   . Hypothyroidism   . Gastritis   . Arthritis   . Migraines   . History of recurrent UTIs   . Alteration in bowel elimination: incontinence   . Urinary bladder incontinence   . Dementia   . Hematoma     Hx of right shin hematoma   Past Surgical History  Procedure Laterality Date  . Tonsillectomy    . Femur im nail  06/01/2011    Procedure: INTRAMEDULLARY (IM) NAIL FEMORAL;  Surgeon: Eldred Manges;  Location: [facility];  Service: Orthopedics;  Laterality: Right;   Family History  Problem Relation Age of Onset  . Arthritis Father    History  Substance Use Topics  . Smoking status: Never Smoker   . Smokeless tobacco: Never Used  . Alcohol Use: No   OB History   Grav Para Term Preterm Abortions TAB SAB Ect Mult Living                 Review of Systems  Unable to perform ROS: Dementia    Allergies  Review of patient's allergies indicates no known allergies.  Home Medications   Current Outpatient Rx  Name  Route  Sig  Dispense  Refill  . ARIPiprazole  (ABILIFY) 2 MG tablet   Oral   Take 2 mg by mouth daily.         Marland Kitchen aspirin 81 MG chewable tablet   Oral   Chew 81 mg by mouth daily.         . Calcium-Vitamin D (CALTRATE 600 PLUS-VIT D PO)   Oral   Take 600 mg by mouth daily.           . carbidopa-levodopa (SINEMET IR) 25-100 MG per tablet   Oral   Take 1 tablet by mouth 3 (three) times daily.   90 tablet   12   . divalproex (DEPAKOTE SPRINKLE) 125 MG capsule   Oral   Take 250-500 mg by mouth 2 (two) times daily. Take 250 mg in the am and 500 mg in the evening         . HYDROcodone-acetaminophen (NORCO/VICODIN) 5-325 MG per tablet   Oral   Take 1 tablet by mouth every 6 (six) hours as needed. For pain         . ibuprofen (ADVIL,MOTRIN) 200 MG tablet   Oral   Take 400 mg by mouth every 6 (six) hours as needed for pain.         Marland Kitchen levothyroxine (SYNTHROID, LEVOTHROID)  200 MCG tablet   Oral   Take 212.5 mcg by mouth daily before breakfast.         . levothyroxine (SYNTHROID, LEVOTHROID) 25 MCG tablet   Oral   Take 12.5 mcg by mouth daily before breakfast.         . loperamide (IMODIUM) 2 MG capsule   Oral   Take 2 mg by mouth 4 (four) times daily as needed. For loose stool         . LORazepam (ATIVAN) 0.5 MG tablet   Oral   Take 0.5 mg by mouth every 12 (twelve) hours as needed. For anxiety or agitation          . Memantine HCl ER (NAMENDA XR) 28 MG CP24   Oral   Take 1 capsule by mouth at bedtime.         . mirtazapine (REMERON) 15 MG tablet   Oral   Take 15 mg by mouth at bedtime.           . multivitamin-lutein (OCUVITE-LUTEIN) CAPS   Oral   Take 1 capsule by mouth daily.         . sennosides-docusate sodium (SENOKOT-S) 8.6-50 MG tablet   Oral   Take 2 tablets by mouth daily as needed. For constipation         . sertraline (ZOLOFT) 100 MG tablet   Oral   Take 100 mg by mouth at bedtime.           . sucralfate (CARAFATE) 1 G tablet   Oral   Take 1 g by mouth 2 (two)  times daily.           . traMADol (ULTRAM) 50 MG tablet   Oral   Take 50 mg by mouth at bedtime.          SpO2 97% Physical Exam  Constitutional: She appears well-developed and well-nourished. No distress.  HENT:  Head: Normocephalic and atraumatic.  Right Ear: Hearing normal.  Left Ear: Hearing normal.  Nose: Nose normal.  Mouth/Throat: Oropharynx is clear and moist and mucous membranes are normal.  Eyes: Conjunctivae and EOM are normal. Pupils are equal, round, and reactive to light.  Neck: Normal range of motion. Neck supple.  Cardiovascular: Regular rhythm, S1 normal and S2 normal.  Exam reveals no gallop and no friction rub.   No murmur heard. Pulmonary/Chest: Effort normal and breath sounds normal. No respiratory distress. She exhibits no tenderness.  Abdominal: Soft. Normal appearance and bowel sounds are normal. There is no hepatosplenomegaly. There is no tenderness. There is no rebound, no guarding, no tenderness at McBurney's point and negative Murphy's sign. No hernia.  Musculoskeletal:       Left hip: She exhibits decreased range of motion, tenderness and bony tenderness. She exhibits no deformity.  Neurological: She is alert. She has normal strength. She is disoriented. No cranial nerve deficit or sensory deficit. Coordination normal. GCS eye subscore is 4. GCS verbal subscore is 5. GCS motor subscore is 6.  Skin: Skin is warm, dry and intact. No rash noted. No cyanosis.  Psychiatric: She has a normal mood and affect. Her speech is normal and behavior is normal. Thought content normal.    ED Course  Procedures (including critical care time) Labs Review Labs Reviewed  CBC WITH DIFFERENTIAL - Abnormal; Notable for the following:    Monocytes Relative 13 (*)    All other components within normal limits  BASIC METABOLIC PANEL - Abnormal; Notable for the  following:    GFR calc non Af Amer 54 (*)    GFR calc Af Amer 62 (*)    All other components within normal limits    Imaging Review Dg Chest 1 View  03/17/2013   *RADIOLOGY REPORT*  Clinical Data: Preoperative examination (weakness, fall from wheelchair  CHEST - 1 VIEW  Comparison: 02/11/2012; 06/01/2011; Thoracic spine radiographs - earlier same day  Findings: Grossly unchanged enlarged cardiac silhouette and mediastinal contours with tortuosity and ectasia of the descending thoracic aorta.  The lungs appear hyperexpanded with flattening of bilateral main diaphragms mild diffuse slightly nodular thickening of the interstitium. Nodular opacities overlying the right mid lung are grossly unchanged.  No new focal airspace opacities.  No pleural effusion or pneumothorax.  No evidence of edema.  No acute osseous abnormality.  IMPRESSION: 1.  Hyperexpanded lungs without acute cardiopulmonary disease. 2.  Nodular opacities overlying the right mid lung are grossly unchanged since the 2012 examination and thus favored to be a benign etiology.   Original Report Authenticated By: Tacey Ruiz, MD   Dg Thoracic Spine 2 View  03/17/2013   *RADIOLOGY REPORT*  Clinical Data: Fall from wheelchair with back pain.  Left hip pain.  THORACIC SPINE - 2 VIEW  Comparison: Chest film of 02/11/2012  Findings: AP view demonstrates mild convex left thoracic spine curvature.  Paraspinous contours grossly unremarkable.  Apparent right sided mediastinal soft tissue fullness is favored to be projectional and is less impressive on the dedicated chest radiograph.  2 lateral views.  These demonstrate mild loss of the upper and mid thoracic vertebral body height at 3 levels.  Felt to be grossly similar to 06/01/2011 lateral chest radiograph.  The lower thoracic spine is not well evaluated on the first lateral view.  IMPRESSION: Mild thoracic compression deformities, felt to be similar to 06/01/2011.  Degradation, especially effecting the lower thoracic spine on the lateral view.   Original Report Authenticated By: Jeronimo Greaves, M.D.   Dg Lumbar Spine 2-3  Views  03/17/2013   *RADIOLOGY REPORT*  Clinical Data: Fall from wheelchair with back pain and left hip pain.  LUMBAR SPINE - 2-3 VIEW  Comparison: CT abdomen/pelvis 02/11/2012  Findings: There is diffuse decreased bone density.  There is spondylosis throughout the lumbar spine.  There is moderate disc space narrowing at the L4-5 and L5-L1 levels.  There is no change and mild grade 1 anterolisthesis of L5 on S1.  There is disc space narrowing at the T12-L1 level.  Facet arthropathy is present over the lower lumbar spine.  Orthopedic screw is seen over the right proximal femur. There is a suggestion of a displaced intertrochanteric fracture of the left hip incompletely visualized on this exam.  IMPRESSION: Spondylosis of the spine with multilevel degenerative disc disease and mild grade 1 anterolisthesis of L5 on S1 unchanged.  No acute compression fracture.  Suggestion of a displaced left intertrochanteric hip fracture incompletely visualized on this exam.  Recommend clinical correlation and left hip films for further evaluation.   Original Report Authenticated By: Elberta Fortis, M.D.   Ct Head Wo Contrast  03/17/2013   *RADIOLOGY REPORT*  Clinical Data:  History of trauma from a fall.  CT HEAD WITHOUT CONTRAST CT CERVICAL SPINE WITHOUT CONTRAST  Technique:  Multidetector CT imaging of the head and cervical spine was performed following the standard protocol without intravenous contrast.  Multiplanar CT image reconstructions of the cervical spine were also generated.  Comparison:  Head CT and cervical spine  CT 02/11/2012.  CT HEAD  Findings: Mild cerebral and cerebellar atrophy.  Extensive patchy and confluent areas of decreased attenuation throughout the deep and periventricular white matter of the cerebral hemispheres bilaterally, compatible with chronic microvascular ischemic disease.  A well-defined focus of low attenuation in the anterior limb of the left internal capsule is unchanged, compatible with an old  lacunar infarction. No acute displaced skull fractures are identified.  No acute intracranial abnormality.  Specifically, no evidence of acute post-traumatic intracranial hemorrhage, no definite regions of acute/subacute cerebral ischemia, no focal mass, mass effect, hydrocephalus or abnormal intra or extra-axial fluid collections.  The visualized paranasal sinuses and mastoids are well pneumatized.  IMPRESSION: 1.  No acute displaced skull fracture or evidence to suggest significant acute traumatic injury to the brain. 2.  Mild cerebral and cerebellar atrophy with extensive chronic microvascular ischemic changes and old lacunar infarction in the anterior limb of the left internal capsule, as above.  CT CERVICAL SPINE  Findings: No acute displaced fractures of the cervical spine. Alignment is anatomic.  Prevertebral soft tissues are normal. Multilevel degenerative disc disease, most severe at C2-C3 and C5- C6.  Mild multilevel facet arthropathy.  Visualized portions of the lung apices are remarkable for bilateral apical pleuroparenchymal thickening, most compatible with scarring related to prior episodes of infection or inflammation.  IMPRESSION: 1.  No evidence of significant acute traumatic injury to the cervical spine. 2.  Multilevel degenerative disc disease and cervical spondylosis, as above.   Original Report Authenticated By: Trudie Reed, M.D.   Ct Cervical Spine Wo Contrast  03/17/2013   *RADIOLOGY REPORT*  Clinical Data:  History of trauma from a fall.  CT HEAD WITHOUT CONTRAST CT CERVICAL SPINE WITHOUT CONTRAST  Technique:  Multidetector CT imaging of the head and cervical spine was performed following the standard protocol without intravenous contrast.  Multiplanar CT image reconstructions of the cervical spine were also generated.  Comparison:  Head CT and cervical spine CT 02/11/2012.  CT HEAD  Findings: Mild cerebral and cerebellar atrophy.  Extensive patchy and confluent areas of decreased  attenuation throughout the deep and periventricular white matter of the cerebral hemispheres bilaterally, compatible with chronic microvascular ischemic disease.  A well-defined focus of low attenuation in the anterior limb of the left internal capsule is unchanged, compatible with an old lacunar infarction. No acute displaced skull fractures are identified.  No acute intracranial abnormality.  Specifically, no evidence of acute post-traumatic intracranial hemorrhage, no definite regions of acute/subacute cerebral ischemia, no focal mass, mass effect, hydrocephalus or abnormal intra or extra-axial fluid collections.  The visualized paranasal sinuses and mastoids are well pneumatized.  IMPRESSION: 1.  No acute displaced skull fracture or evidence to suggest significant acute traumatic injury to the brain. 2.  Mild cerebral and cerebellar atrophy with extensive chronic microvascular ischemic changes and old lacunar infarction in the anterior limb of the left internal capsule, as above.  CT CERVICAL SPINE  Findings: No acute displaced fractures of the cervical spine. Alignment is anatomic.  Prevertebral soft tissues are normal. Multilevel degenerative disc disease, most severe at C2-C3 and C5- C6.  Mild multilevel facet arthropathy.  Visualized portions of the lung apices are remarkable for bilateral apical pleuroparenchymal thickening, most compatible with scarring related to prior episodes of infection or inflammation.  IMPRESSION: 1.  No evidence of significant acute traumatic injury to the cervical spine. 2.  Multilevel degenerative disc disease and cervical spondylosis, as above.   Original Report Authenticated By: Trudie Reed, M.D.  MDM  Diagnosis: Left hip fracture after fall  His present to the ER for evaluation after a fall from her wheelchair. Patient complaining of left hip pain after fall. X-ray confirms fracture. Remainder of the work up unremarkable.  Case discussed with Doctor Roda Shutters of  [facility] (seen in 2012 for Right Hip Fx by Dr. Ophelia Charter).   Patient will be admitted by the hospitalist.    Gilda Crease, MD 03/17/13 [id number]

## 2013-03-17 NOTE — Consult Note (Signed)
ORTHOPAEDIC CONSULTATION  REQUESTING PHYSICIAN: Provider Default, MD  Chief Complaint: left hip fracture  HPI: Bonnie Riley is a 79 y.o. female who complains of left hip pain s/p fall at the nursing facility today.  She has MMPs including dementia.  Exquisite pain with any weight bearing.  Denies any LOC or head injury.  Past Medical History  Diagnosis Date  . Dementia   . Altered mental status   . Depression   . Hypothyroidism   . Gastritis   . Arthritis   . Migraines   . History of recurrent UTIs   . Alteration in bowel elimination: incontinence   . Urinary bladder incontinence   . Dementia   . Hematoma     Hx of right shin hematoma   Past Surgical History  Procedure Laterality Date  . Tonsillectomy    . Femur im nail  06/01/2011    Procedure: INTRAMEDULLARY (IM) NAIL FEMORAL;  Surgeon: Eldred Manges;  Location: [facility];  Service: Orthopedics;  Laterality: Right;   History   Social History  . Marital Status: Widowed    Spouse Name: N/A    Number of Children: N/A  . Years of Education: N/A   Social History Main Topics  . Smoking status: Never Smoker   . Smokeless tobacco: Never Used  . Alcohol Use: No  . Drug Use: No  . Sexual Activity: Not Currently   Other Topics Concern  . None   Social History Narrative  . None   Family History  Problem Relation Age of Onset  . Arthritis Father    No Known Allergies Prior to Admission medications   Medication Sig Start Date End Date Taking? Authorizing Provider  ARIPiprazole (ABILIFY) 2 MG tablet Take 2 mg by mouth daily.   Yes Historical Provider, MD  aspirin 81 MG chewable tablet Chew 81 mg by mouth daily.   Yes Historical Provider, MD  Calcium-Vitamin D (CALTRATE 600 PLUS-VIT D PO) Take 600 mg by mouth daily.     Yes Historical Provider, MD  carbidopa-levodopa (SINEMET IR) 25-100 MG per tablet Take 1 tablet by mouth 3 (three) times daily. 11/21/12  Yes Levert Feinstein, MD  divalproex (DEPAKOTE SPRINKLE) 125 MG  capsule Take 250-500 mg by mouth 2 (two) times daily. Take 250 mg in the am and 500 mg in the evening   Yes Historical Provider, MD  donepezil (ARICEPT) 23 MG TABS tablet Take 23 mg by mouth at bedtime.   Yes Historical Provider, MD  HYDROcodone-acetaminophen (NORCO/VICODIN) 5-325 MG per tablet Take 1 tablet by mouth every 6 (six) hours as needed. For pain   Yes Historical Provider, MD  ibuprofen (ADVIL,MOTRIN) 200 MG tablet Take 400 mg by mouth every 6 (six) hours as needed for pain.   Yes Historical Provider, MD  levothyroxine (SYNTHROID, LEVOTHROID) 200 MCG tablet Take 212.5 mcg by mouth daily before breakfast.   Yes Historical Provider, MD  levothyroxine (SYNTHROID, LEVOTHROID) 25 MCG tablet Take 12.5 mcg by mouth daily before breakfast.   Yes Historical Provider, MD  loperamide (IMODIUM) 2 MG capsule Take 2 mg by mouth 4 (four) times daily as needed. For loose stool   Yes Historical Provider, MD  LORazepam (ATIVAN) 0.5 MG tablet Take 0.5 mg by mouth every 8 (eight) hours as needed for anxiety (agitation). For anxiety or agitation   Yes Historical Provider, MD  Memantine HCl ER (NAMENDA XR) 28 MG CP24 Take 1 capsule by mouth at bedtime.   Yes Historical Provider, MD  mirtazapine (  REMERON) 15 MG tablet Take 15 mg by mouth at bedtime.     Yes Historical Provider, MD  multivitamin-lutein (OCUVITE-LUTEIN) CAPS Take 1 capsule by mouth daily.   Yes Historical Provider, MD  sennosides-docusate sodium (SENOKOT-S) 8.6-50 MG tablet Take 2 tablets by mouth daily as needed. For constipation   Yes Historical Provider, MD  sertraline (ZOLOFT) 100 MG tablet Take 100 mg by mouth at bedtime.     Yes Historical Provider, MD  sucralfate (CARAFATE) 1 G tablet Take 1 g by mouth 2 (two) times daily.     Yes Historical Provider, MD  traMADol (ULTRAM) 50 MG tablet Take 50 mg by mouth at bedtime.   Yes Historical Provider, MD   Dg Chest 1 View  03/17/2013   *RADIOLOGY REPORT*  Clinical Data: Preoperative examination  (weakness, fall from wheelchair  CHEST - 1 VIEW  Comparison: 02/11/2012; 06/01/2011; Thoracic spine radiographs - earlier same day  Findings: Grossly unchanged enlarged cardiac silhouette and mediastinal contours with tortuosity and ectasia of the descending thoracic aorta.  The lungs appear hyperexpanded with flattening of bilateral main diaphragms mild diffuse slightly nodular thickening of the interstitium. Nodular opacities overlying the right mid lung are grossly unchanged.  No new focal airspace opacities.  No pleural effusion or pneumothorax.  No evidence of edema.  No acute osseous abnormality.  IMPRESSION: 1.  Hyperexpanded lungs without acute cardiopulmonary disease. 2.  Nodular opacities overlying the right mid lung are grossly unchanged since the 2012 examination and thus favored to be a benign etiology.   Original Report Authenticated By: Tacey Ruiz, MD   Dg Thoracic Spine 2 View  03/17/2013   *RADIOLOGY REPORT*  Clinical Data: Fall from wheelchair with back pain.  Left hip pain.  THORACIC SPINE - 2 VIEW  Comparison: Chest film of 02/11/2012  Findings: AP view demonstrates mild convex left thoracic spine curvature.  Paraspinous contours grossly unremarkable.  Apparent right sided mediastinal soft tissue fullness is favored to be projectional and is less impressive on the dedicated chest radiograph.  2 lateral views.  These demonstrate mild loss of the upper and mid thoracic vertebral body height at 3 levels.  Felt to be grossly similar to 06/01/2011 lateral chest radiograph.  The lower thoracic spine is not well evaluated on the first lateral view.  IMPRESSION: Mild thoracic compression deformities, felt to be similar to 06/01/2011.  Degradation, especially effecting the lower thoracic spine on the lateral view.   Original Report Authenticated By: Jeronimo Greaves, M.D.   Dg Lumbar Spine 2-3 Views  03/17/2013   *RADIOLOGY REPORT*  Clinical Data: Fall from wheelchair with back pain and left hip pain.   LUMBAR SPINE - 2-3 VIEW  Comparison: CT abdomen/pelvis 02/11/2012  Findings: There is diffuse decreased bone density.  There is spondylosis throughout the lumbar spine.  There is moderate disc space narrowing at the L4-5 and L5-L1 levels.  There is no change and mild grade 1 anterolisthesis of L5 on S1.  There is disc space narrowing at the T12-L1 level.  Facet arthropathy is present over the lower lumbar spine.  Orthopedic screw is seen over the right proximal femur. There is a suggestion of a displaced intertrochanteric fracture of the left hip incompletely visualized on this exam.  IMPRESSION: Spondylosis of the spine with multilevel degenerative disc disease and mild grade 1 anterolisthesis of L5 on S1 unchanged.  No acute compression fracture.  Suggestion of a displaced left intertrochanteric hip fracture incompletely visualized on this exam.  Recommend clinical  correlation and left hip films for further evaluation.   Original Report Authenticated By: Elberta Fortis, M.D.   Dg Hip Complete Left  03/17/2013   *RADIOLOGY REPORT*  Clinical Data: Fall from wheelchair.  LEFT HIP - COMPLETE 2+ VIEW  Comparison: None.  Findings: Examination demonstrates a displaced intertrochanteric fracture of the left hip.  The left lesser trochanter is displaced. There is mild symmetric degenerative change of the hips.  An intramedullary nail is present over the right femur with associated screw bridging the right femoral neck into the femoral head intact. There are two small metallic densities of the right pelvis.  IMPRESSION: Displaced left intertrochanteric hip fracture.   Original Report Authenticated By: Elberta Fortis, M.D.   Ct Head Wo Contrast  03/17/2013   *RADIOLOGY REPORT*  Clinical Data:  History of trauma from a fall.  CT HEAD WITHOUT CONTRAST CT CERVICAL SPINE WITHOUT CONTRAST  Technique:  Multidetector CT imaging of the head and cervical spine was performed following the standard protocol without intravenous  contrast.  Multiplanar CT image reconstructions of the cervical spine were also generated.  Comparison:  Head CT and cervical spine CT 02/11/2012.  CT HEAD  Findings: Mild cerebral and cerebellar atrophy.  Extensive patchy and confluent areas of decreased attenuation throughout the deep and periventricular white matter of the cerebral hemispheres bilaterally, compatible with chronic microvascular ischemic disease.  A well-defined focus of low attenuation in the anterior limb of the left internal capsule is unchanged, compatible with an old lacunar infarction. No acute displaced skull fractures are identified.  No acute intracranial abnormality.  Specifically, no evidence of acute post-traumatic intracranial hemorrhage, no definite regions of acute/subacute cerebral ischemia, no focal mass, mass effect, hydrocephalus or abnormal intra or extra-axial fluid collections.  The visualized paranasal sinuses and mastoids are well pneumatized.  IMPRESSION: 1.  No acute displaced skull fracture or evidence to suggest significant acute traumatic injury to the brain. 2.  Mild cerebral and cerebellar atrophy with extensive chronic microvascular ischemic changes and old lacunar infarction in the anterior limb of the left internal capsule, as above.  CT CERVICAL SPINE  Findings: No acute displaced fractures of the cervical spine. Alignment is anatomic.  Prevertebral soft tissues are normal. Multilevel degenerative disc disease, most severe at C2-C3 and C5- C6.  Mild multilevel facet arthropathy.  Visualized portions of the lung apices are remarkable for bilateral apical pleuroparenchymal thickening, most compatible with scarring related to prior episodes of infection or inflammation.  IMPRESSION: 1.  No evidence of significant acute traumatic injury to the cervical spine. 2.  Multilevel degenerative disc disease and cervical spondylosis, as above.   Original Report Authenticated By: Trudie Reed, M.D.   Ct Cervical Spine Wo  Contrast  03/17/2013   *RADIOLOGY REPORT*  Clinical Data:  History of trauma from a fall.  CT HEAD WITHOUT CONTRAST CT CERVICAL SPINE WITHOUT CONTRAST  Technique:  Multidetector CT imaging of the head and cervical spine was performed following the standard protocol without intravenous contrast.  Multiplanar CT image reconstructions of the cervical spine were also generated.  Comparison:  Head CT and cervical spine CT 02/11/2012.  CT HEAD  Findings: Mild cerebral and cerebellar atrophy.  Extensive patchy and confluent areas of decreased attenuation throughout the deep and periventricular white matter of the cerebral hemispheres bilaterally, compatible with chronic microvascular ischemic disease.  A well-defined focus of low attenuation in the anterior limb of the left internal capsule is unchanged, compatible with an old lacunar infarction. No acute displaced skull  fractures are identified.  No acute intracranial abnormality.  Specifically, no evidence of acute post-traumatic intracranial hemorrhage, no definite regions of acute/subacute cerebral ischemia, no focal mass, mass effect, hydrocephalus or abnormal intra or extra-axial fluid collections.  The visualized paranasal sinuses and mastoids are well pneumatized.  IMPRESSION: 1.  No acute displaced skull fracture or evidence to suggest significant acute traumatic injury to the brain. 2.  Mild cerebral and cerebellar atrophy with extensive chronic microvascular ischemic changes and old lacunar infarction in the anterior limb of the left internal capsule, as above.  CT CERVICAL SPINE  Findings: No acute displaced fractures of the cervical spine. Alignment is anatomic.  Prevertebral soft tissues are normal. Multilevel degenerative disc disease, most severe at C2-C3 and C5- C6.  Mild multilevel facet arthropathy.  Visualized portions of the lung apices are remarkable for bilateral apical pleuroparenchymal thickening, most compatible with scarring related to prior  episodes of infection or inflammation.  IMPRESSION: 1.  No evidence of significant acute traumatic injury to the cervical spine. 2.  Multilevel degenerative disc disease and cervical spondylosis, as above.   Original Report Authenticated By: Trudie Reed, M.D.   Dg Femur Left Port  03/17/2013   *RADIOLOGY REPORT*  Clinical Data: History of fall complaining of left leg pain.  PORTABLE LEFT FEMUR - 2 VIEW  Comparison: No priors.  Findings: AP and lateral views of the left femur are limited by lack of visualization of the proximal femur (see separate dictation for left hip which demonstrates an intertrochanteric fracture). The mid and distal third of the left femur appear intact without acute displaced fracture.  IMPRESSION: 1.  No acute displaced fracture of the mid or distal third of the left femur.  Intertrochanteric fracture of the left hip was not imaged, see report for a left hip radiograph 03/17/2013 for full description.   Original Report Authenticated By: Trudie Reed, M.D.   Dg Knee Left Port  03/17/2013   *RADIOLOGY REPORT*  Clinical Data: History of fall.  Left knee pain.  PORTABLE LEFT KNEE - 1-2 VIEW  Comparison: No priors.  Findings: Two views of the left knee demonstrate no acute displaced fracture, subluxation or dislocation.  There is joint space narrowing, subchondral sclerosis, subchondral cyst formation and osteophyte formation, most pronounced in the patellofemoral compartment, compatible with osteoarthritis.  IMPRESSION: 1.  No acute radiographic abnormality of the left knee. 2.  Tricompartmental osteoarthritis, most severe in the patellofemoral compartment.   Original Report Authenticated By: Trudie Reed, M.D.    Positive ROS: All other systems have been reviewed and were otherwise negative with the exception of those mentioned in the HPI and as above.  Physical Exam: General: Alert, no acute distress Cardiovascular: No pedal edema Respiratory: No cyanosis, no use of  accessory musculature GI: No organomegaly, abdomen is soft and non-tender Skin: No lesions in the area of chief complaint Neurologic: Sensation intact distally Psychiatric: Patient is competent for consent with normal mood and affect Lymphatic: No axillary or cervical lymphadenopathy  MUSCULOSKELETAL:   LLE - limited hip ROM due to pain. Foot wwp, nvi, 2+ pulses.   Assessment: 1. Left intertrochanteric hip fx  Plan: 1. NWB LLE for now 2. Plan for surgery Sunday 3. NPO after midnight 4. Please medically optimize tonight 5. Hold any anticoagulation tonight 6. Please call with questions  Thank you for the consult and the opportunity to see this patient.  Mayra Reel, MD [facility] [phone] 6:01 PM

## 2013-03-17 NOTE — H&P (Signed)
H&P update  The surgical history has been reviewed and remains accurate without interval change.  The patient was re-examined and patient's physiologic condition has not changed significantly in the last 30 days. The condition still exists that makes this procedure necessary. The treatment plan remains the same, without new options for care.  No new pharmacological allergies or types of therapy has been initiated that would change the plan or the appropriateness of the plan.  The patient and/or family understand the potential benefits and risks.  Mayra Reel, MD 03/17/2013 9:33 PM

## 2013-03-17 NOTE — ED Notes (Signed)
Patient is from nursing facility and was attempting to get out of her wheelchair and fell out onto her left side.Patient presents with left sided hip pain.  EMS gave Fentanyl 25 mcg in route to ED.

## 2013-03-18 ENCOUNTER — Encounter (HOSPITAL_COMMUNITY): Payer: Self-pay | Admitting: Anesthesiology

## 2013-03-18 ENCOUNTER — Inpatient Hospital Stay (HOSPITAL_COMMUNITY): Payer: Medicare Other

## 2013-03-18 ENCOUNTER — Encounter (HOSPITAL_COMMUNITY)
Admission: EM | Disposition: A | Discharge status: Skilled Nursing Facility | Payer: Self-pay | Source: Home / Self Care | Attending: Internal Medicine

## 2013-03-18 ENCOUNTER — Inpatient Hospital Stay (HOSPITAL_COMMUNITY): Payer: Medicare Other | Admitting: Anesthesiology

## 2013-03-18 DIAGNOSIS — S72009D Fracture of unspecified part of neck of unspecified femur, subsequent encounter for closed fracture with routine healing: Secondary | ICD-10-CM

## 2013-03-18 DIAGNOSIS — W19XXXA Unspecified fall, initial encounter: Secondary | ICD-10-CM

## 2013-03-18 HISTORY — PX: INTRAMEDULLARY (IM) NAIL INTERTROCHANTERIC: SHX5875

## 2013-03-18 LAB — SURGICAL PCR SCREEN: Staphylococcus aureus: NEGATIVE

## 2013-03-18 SURGERY — FIXATION, FRACTURE, INTERTROCHANTERIC, WITH INTRAMEDULLARY ROD
Anesthesia: Spinal | Site: Hip | Laterality: Left | Wound class: Clean

## 2013-03-18 MED ORDER — ASPIRIN EC 325 MG PO TBEC: 325.0000 mg | DELAYED_RELEASE_TABLET | Freq: Two times a day (BID) | ORAL | Status: DC

## 2013-03-18 MED ORDER — SODIUM CHLORIDE 0.9 % IV SOLN
INTRAVENOUS | Status: DC | PRN
  Administered 2013-03-18: 11:00:00 via INTRAVENOUS

## 2013-03-18 MED ORDER — 0.9 % SODIUM CHLORIDE (POUR BTL) OPTIME
TOPICAL | Status: DC | PRN
  Administered 2013-03-18: 1000 mL

## 2013-03-18 MED ORDER — PROPOFOL 10 MG/ML IV EMUL
INTRAVENOUS | Status: DC | PRN
  Administered 2013-03-18: 30 mg via INTRAVENOUS

## 2013-03-18 MED ORDER — SODIUM CHLORIDE 0.9 % IV BOLUS (SEPSIS)
500.0000 mL | Freq: Once | INTRAVENOUS | Status: AC
  Administered 2013-03-18: 500 mL via INTRAVENOUS

## 2013-03-18 MED ORDER — HYDROCODONE-ACETAMINOPHEN 5-325 MG PO TABS: 1.0000 | ORAL_TABLET | Freq: Four times a day (QID) | ORAL | Status: DC | PRN

## 2013-03-18 MED ORDER — BUPIVACAINE IN DEXTROSE 0.75-8.25 % IT SOLN
INTRATHECAL | Status: DC | PRN
  Administered 2013-03-18: 1.6 mL via INTRATHECAL

## 2013-03-18 MED ORDER — FENTANYL CITRATE 0.05 MG/ML IJ SOLN: 25.0000 ug | INTRAMUSCULAR | Status: DC | PRN

## 2013-03-18 MED ORDER — ASPIRIN EC 325 MG PO TBEC
325.0000 mg | DELAYED_RELEASE_TABLET | Freq: Two times a day (BID) | ORAL | Status: DC
  Administered 2013-03-18 – 2013-03-20 (×4): 325 mg via ORAL
  Filled 2013-03-18 (×6): qty 1

## 2013-03-18 MED ORDER — CEFAZOLIN SODIUM-DEXTROSE 2-3 GM-% IV SOLR
INTRAVENOUS | Status: DC | PRN
  Administered 2013-03-18: 2 g via INTRAVENOUS

## 2013-03-18 MED ORDER — PROPOFOL INFUSION 10 MG/ML OPTIME
INTRAVENOUS | Status: DC | PRN
  Administered 2013-03-18: 75 ug/kg/min via INTRAVENOUS

## 2013-03-18 SURGICAL SUPPLY — 48 items
BANDAGE ELASTIC 6 VELCRO ST LF (GAUZE/BANDAGES/DRESSINGS) ×2 IMPLANT
BANDAGE GAUZE ELAST BULKY 4 IN (GAUZE/BANDAGES/DRESSINGS) ×2 IMPLANT
BLADE SURG 15 STRL LF DISP TIS (BLADE) ×1 IMPLANT
BLADE SURG 15 STRL SS (BLADE) ×1
BNDG COHESIVE 4X5 TAN NS LF (GAUZE/BANDAGES/DRESSINGS) ×2 IMPLANT
CLOTH BEACON ORANGE TIMEOUT ST (SAFETY) ×2 IMPLANT
COVER SURGICAL LIGHT HANDLE (MISCELLANEOUS) IMPLANT
DRAPE PROXIMA HALF (DRAPES) IMPLANT
DRAPE STERI IOBAN 125X83 (DRAPES) ×2 IMPLANT
DRSG MEPILEX BORDER 4X4 (GAUZE/BANDAGES/DRESSINGS) ×2 IMPLANT
DRSG MEPILEX BORDER 4X8 (GAUZE/BANDAGES/DRESSINGS) ×2 IMPLANT
DRSG PAD ABDOMINAL 8X10 ST (GAUZE/BANDAGES/DRESSINGS) ×4 IMPLANT
DURAPREP 26ML APPLICATOR (WOUND CARE) ×2 IMPLANT
ELECT REM PT RETURN 9FT ADLT (ELECTROSURGICAL) ×2
ELECTRODE REM PT RTRN 9FT ADLT (ELECTROSURGICAL) ×1 IMPLANT
FACESHIELD LNG OPTICON STERILE (SAFETY) IMPLANT
GAUZE XEROFORM 5X9 LF (GAUZE/BANDAGES/DRESSINGS) ×2 IMPLANT
GLOVE BIOGEL PI IND STRL 8 (GLOVE) ×2 IMPLANT
GLOVE BIOGEL PI INDICATOR 8 (GLOVE) ×2
GLOVE ECLIPSE 8.0 STRL XLNG CF (GLOVE) ×2 IMPLANT
GLOVE ORTHO TXT STRL SZ7.5 (GLOVE) ×2 IMPLANT
GOWN PREVENTION PLUS LG XLONG (DISPOSABLE) IMPLANT
GOWN PREVENTION PLUS XLARGE (GOWN DISPOSABLE) ×2 IMPLANT
GOWN STRL NON-REIN LRG LVL3 (GOWN DISPOSABLE) ×2 IMPLANT
GUIDE PIN 3.2 LONG (PIN) ×2 IMPLANT
GUIDE PIN 3.2MM (MISCELLANEOUS) ×2
GUIDE PIN ORTH 343X3.2XBRAD (MISCELLANEOUS) ×2 IMPLANT
HIP SCREW SET (Screw) ×2 IMPLANT
KIT BASIN OR (CUSTOM PROCEDURE TRAY) ×2 IMPLANT
KIT ROOM TURNOVER OR (KITS) IMPLANT
MANIFOLD NEPTUNE II (INSTRUMENTS) ×2 IMPLANT
NAIL IMSH 10X36 (Nail) ×2 IMPLANT
NS IRRIG 1000ML POUR BTL (IV SOLUTION) ×2 IMPLANT
PACK GENERAL/GYN (CUSTOM PROCEDURE TRAY) ×2 IMPLANT
PAD ARMBOARD 7.5X6 YLW CONV (MISCELLANEOUS) IMPLANT
PAD CAST 4YDX4 CTTN HI CHSV (CAST SUPPLIES) ×2 IMPLANT
PADDING CAST COTTON 4X4 STRL (CAST SUPPLIES) ×2
SCREW COMPRESSION (Screw) ×2 IMPLANT
SCREW LAG 90MM (Screw) ×1 IMPLANT
SCREW LAGSTD 90X21X12.7X9 (Screw) ×1 IMPLANT
STAPLER VISISTAT 35W (STAPLE) ×2 IMPLANT
SUT VIC AB 0 CT1 27 (SUTURE)
SUT VIC AB 0 CT1 27XBRD ANBCTR (SUTURE) IMPLANT
SUT VIC AB 2-0 CT1 27 (SUTURE) ×2
SUT VIC AB 2-0 CT1 TAPERPNT 27 (SUTURE) ×2 IMPLANT
TOWEL OR 17X24 6PK STRL BLUE (TOWEL DISPOSABLE) IMPLANT
TOWEL OR 17X26 10 PK STRL BLUE (TOWEL DISPOSABLE) ×2 IMPLANT
WATER STERILE IRR 1000ML POUR (IV SOLUTION) ×2 IMPLANT

## 2013-03-18 NOTE — Op Note (Signed)
INDICATIONS: Bonnie Riley is a 79 y.o.-year-old female who was involved in a fall and sustained a left intertrochanteric hip fracture. The risks and benefits of the procedure discussed with the family prior to the procedure and all questions were answered; consent was obtained.  PREOPERATIVE DIAGNOSIS: left hip fracture (intertrochanteric-type)   POSTOPERATIVE DIAGNOSIS: Same   PROCEDURE: Treatment of intertrochanteric fracture with intramedullary implant. CPT [phone]   SURGEON: N. Glee Arvin, M.D.   ANESTHESIA: general   IV FLUIDS AND URINE: See anesthesia record   ESTIMATED BLOOD LOSS: see anesthesia record  IMPLANTS: S&N IMHS 10 x 36  DRAINS: None.   COMPLICATIONS: None.   DESCRIPTION OF PROCEDURE: The patient was brought to the operating room and placed supine on the operating table. The patient's leg had been signed prior to the procedure. The patient had the anesthesia placed by the anesthesiologist. The prep verification and incision time-outs were performed to confirm that this was the correct patient, site, side and location. The patient had an SCD on the opposite lower extremity. The patient did receive antibiotics prior to the incision and was re-dosed during the procedure as needed at indicated intervals. The patient was positioned on the fracture table with the table in traction and internal rotation to reduce the hip. The well leg was placed in a hemi-lithotomy position and all bony prominences were well-padded. The patient had the lower extremity prepped and draped in the standard surgical fashion. The incision was made 4 finger breadths superior to the greater trochanter. A guide pin was inserted into the tip of the greater trochanter under fluoroscopic guidance. An opening reamer was used to gain access to the femoral canal. The nail length was measured and inserted down the femoral canal to its proper depth. The appropriate version of insertion for the lag screw was found under  fluoroscopy. The lag screw was inserted as near to center-center in the head as possible. The leg was taken out of traction, then the compression screw was then placed to compress across the fracture using the lag screw. The wound was copiously irrigated with saline and the subcutaneous layer closed with 2.0 vicryl and the skin was reapproximated with staples. The wounds were cleaned and dried a final time and a sterile dressing was placed. The hip was taken through a range of motion at the end of the case under fluoroscopic imaging to visualize the approach-withdraw phenomenon and confirm implant length in the head. The patient was then awakened from anesthesia and taken to the recovery room in stable condition. All counts were correct at the end of the case.   POSTOPERATIVE PLAN: The patient will be weight bearing as tolerated and will return in 2 weeks for staple removal and the patient will receive DVT prophylaxis based on other medications, activity level, and risk ratio of bleeding to thrombosis.  I recommend aspirin 325 BID.

## 2013-03-18 NOTE — Anesthesia Preprocedure Evaluation (Addendum)
Anesthesia Evaluation  Patient identified by MRN, date of birth, ID band Patient awake  General Assessment Comment:.  Dementia     .  Altered mental status     .  Depression     .  Hypothyroidism     .  Gastritis     .  Arthritis     .  Migraines     .  History of recurrent UTIs     .  Alteration in bowel elimination: incontinence     .  Urinary bladder incontinence     .  Dementia     Reviewed: Allergy & Precautions, H&P , NPO status , Patient's Chart, lab work & pertinent test results  Airway Mallampati: II TM Distance: >3 FB Neck ROM: Limited    Dental no notable dental hx.    Pulmonary neg pulmonary ROS,  CXR: reviewed. breath sounds clear to auscultation  Pulmonary exam normal       Cardiovascular negative cardio ROS  Rhythm:Regular Rate:Normal  ECG: SR 71   Neuro/Psych  Headaches, PSYCHIATRIC DISORDERS Depression dementia negative neurological ROS     GI/Hepatic negative GI ROS, Neg liver ROS,   Endo/Other  Hypothyroidism   Renal/GU negative Renal ROS  negative genitourinary   Musculoskeletal negative musculoskeletal ROS (+)   Abdominal   Peds negative pediatric ROS (+)  Hematology negative hematology ROS (+)   Anesthesia Other Findings   Reproductive/Obstetrics negative OB ROS                           Anesthesia Physical Anesthesia Plan  ASA: III  Anesthesia Plan: Spinal   Post-op Pain Management:    Induction: Intravenous  Airway Management Planned: Oral ETT  Additional Equipment:   Intra-op Plan:   Post-operative Plan: Extubation in OR  Informed Consent: I have reviewed the patients History and Physical, chart, labs and discussed the procedure including the risks, benefits and alternatives for the proposed anesthesia with the patient or authorized representative who has indicated his/her understanding and acceptance.   Dental advisory given  Plan  Discussed with: CRNA  Anesthesia Plan Comments: (H/O glidescope intubation 2012. Discussed risks/benefits of spinal including headache, backache, failure, bleeding, infection, and nerve damage. Patient consents to spinal. Questions answered. Coagulation studies and platelet count acceptable. Heparin has been held.)      Anesthesia Quick Evaluation

## 2013-03-18 NOTE — Preoperative (Signed)
Beta Blockers   Reason not to administer Beta Blockers:Not Applicable 

## 2013-03-18 NOTE — Anesthesia Postprocedure Evaluation (Signed)
  Anesthesia Post-op Note  Patient: Bonnie Riley  Procedure(s) Performed: Procedure(s) (LRB): INTRAMEDULLARY (IM) NAIL INTERTROCHANTRIC (Left)  Patient Location: PACU  Anesthesia Type: Spinal  Level of Consciousness: awake and alert   Airway and Oxygen Therapy: Patient Spontanous Breathing  Post-op Pain: mild  Post-op Assessment: Post-op Vital signs reviewed, Patient's Cardiovascular Status Stable, Respiratory Function Stable, Patent Airway and No signs of Nausea or vomiting  Last Vitals:  Filed Vitals:   03/18/13 1010  BP: 118/77  Pulse: 67  Temp: 36.7 C  Resp: 14    Post-op Vital Signs: stable   Complications: No apparent anesthesia complications. Moving both feet. No complaints.

## 2013-03-18 NOTE — Transfer of Care (Signed)
Immediate Anesthesia Transfer of Care Note  Patient: Bonnie Riley  Procedure(s) Performed: Procedure(s): INTRAMEDULLARY (IM) NAIL INTERTROCHANTRIC (Left)  Patient Location: PACU  Anesthesia Type:MAC and Spinal  Level of Consciousness: awake, alert  and oriented  Airway & Oxygen Therapy: Patient Spontanous Breathing and Patient connected to face mask oxygen  Post-op Assessment: Report given to PACU RN and Post -op Vital signs reviewed and stable  Post vital signs: Reviewed and stable  Complications: No apparent anesthesia complications

## 2013-03-18 NOTE — Progress Notes (Signed)
Both Dr. Roda Shutters and Dr. Eli Hose aware via phone of pt's low urine output since back from surgery this afternoon. Awaiting orders.

## 2013-03-18 NOTE — Anesthesia Procedure Notes (Signed)
Spinal  Patient location during procedure: OR Staffing Anesthesiologist: Azell Der Performed by: anesthesiologist  Preanesthetic Checklist Completed: patient identified, site marked, surgical consent, pre-op evaluation, timeout performed, IV checked, risks and benefits discussed and monitors and equipment checked Spinal Block Patient position: left lateral decubitus Prep: Betadine Patient monitoring: heart rate, cardiac monitor, continuous pulse ox and blood pressure Approach: midline Location: L3-4 Injection technique: single-shot Needle Needle type: Quincke  Needle gauge: 22 G Needle length: 9 cm Additional Notes No paresthesia. CSF clear.

## 2013-03-18 NOTE — Brief Op Note (Signed)
Brief Op Note  Date of Surgery: 03/18/2013  Preoperative Diagnosis: Left intertrochanteric hip fracture  Postoperative Diagnosis: same  Procedure: Procedure(s): INTRAMEDULLARY (IM) NAIL INTERTROCHANTRIC  Implants: Katrinka Blazing and nephew IMHS 10 x 36  Surgeons: Surgeon(s): Honorio Devol Glee Arvin, MD  Anesthesia: Spinal  Drains: none  Estimated Blood Loss: See anesthesia record  Complications: None  Condition to PACU: Stable  Taven Strite Glee Arvin, MD [facility] 03/18/2013 12:07 PM

## 2013-03-18 NOTE — Progress Notes (Signed)
[facility] PROGRESS NOTE  Lucely Leard WUJ:[id number] DOB: 08-16-1925 DOA: 03/17/2013 PCP: MAZZOCCHI, Rise Mu, MD  HPI/Brief narrative 79 y.o. female, a resident of [address] assisted living facility, with a past medical history of moderate dementia associated with behavioral abnormalities (agitation, anxiety and aggression), recurrent UTIs, hypothyroidism, bowel and bladder incontinence, depression, unsteady gait and falls, presented to the ED on 03/17/13 following a fall at the nursing facility today associated with left hip pain. X-rays revealed displaced left intertrochanteric hip fracture. Orthopedics was consulted and hospitalist admission was requested.  Assessment/Plan:  Displaced intertrochanteric left hip fracture  - Sustained after a mechanical fall.  - Patient has no cardiac or pulmonary history. She is mild to moderate risk for perioperative CV events from advanced age, overall frail health. She is cleared for surgery without any further CV workup.  - Placed Foley catheter secondary to bladder and bowel incontinence history  - Currently n.p.o. for surgery, bedrest and heparin for DVT prophylaxis. Further activity status and DVT prophylaxis to be addressed by the surgeons.  - Orthopedic consultation appreciated and plan for surgery today.  Moderate dementia with behavioral changes (agitation and anxiety)/depression  - Continue PTA medications and monitor closely.  - Fall precautions.  - Per report, patient had a good night without agitation and slept well.  Hypothyroidism  - Continue home dose of Synthroid.   History of recurrent UTI's  - UA negative  History of unsteady gait and falls  - This is her second fall within a week. She will likely need higher level of care/SNF at discharge.   Right midlung nodules on chest x-ray  - Lifelong nonsmoker  - Said to be stable compared to prior comparison  - Outpatient follow up    DVT prophylaxis: Per  orthopedics. Heparin held this morning for surgery. Lines/catheters:  PIV, Foley catheter Nutrition: Currently n.p.o.  Activity:  Bedrest. Postop activity per orthopedics. Code Status: DO NOT RESUSCITATE Family Communication: Discussed with daughter/health care power of attorney Ms. Becky at bedside. Disposition Plan: Possibly SNF when stable.   Consultants:  Orthopedics  Procedures:  Foley catheter 9/6 >  Antibiotics:  None   Subjective: Denies complaints.  Objective: Filed Vitals:   03/17/13 1805 03/17/13 2132 03/18/13 0518 03/18/13 1010  BP: 167/80 149/79 146/72 118/77  Pulse: 69 61 73 67  Temp: 97.9 F (36.6 C) 97.8 F (36.6 C) 97.6 F (36.4 C) 98.1 F (36.7 C)  TempSrc: Oral Oral Oral Oral  Resp:  18 16 14   Height: 5' (1.524 m)     Weight: 63.957 kg (141 lb)     SpO2: 96% 94% 97% 90%    Intake/Output Summary (Last 24 hours) at 03/18/13 1112 Last data filed at 03/18/13 0801  Gross per 24 hour  Intake  537.5 ml  Output    325 ml  Net  212.5 ml   Filed Weights   03/17/13 1805  Weight: 63.957 kg (141 lb)     Exam:  General exam: Moderately built and nourished female patient lying comfortably supine in bed. Respiratory system: Clear. No increased work of breathing. Cardiovascular system: S1 & S2 heard, RRR. No JVD, murmurs, gallops, clicks or pedal edema. Gastrointestinal system: Abdomen is nondistended, soft and nontender. Normal bowel sounds heard. Central nervous system: Alert and oriented x1. No focal neurological deficits. Extremities: Symmetric 5 x 5 power. Limited mobility of left lower extremity secondary to pain in left hip.   Data Reviewed: Basic Metabolic Panel:  Recent Labs Lab 03/17/13 1422  NA 139  K 4.0  CL 102  CO2 30  GLUCOSE 90  BUN 20  CREATININE 0.94  CALCIUM 9.4   Liver Function Tests: No results found for this basename: AST, ALT, ALKPHOS, BILITOT, PROT, ALBUMIN,  in the last 168 hours No results found for this  basename: LIPASE, AMYLASE,  in the last 168 hours No results found for this basename: AMMONIA,  in the last 168 hours CBC:  Recent Labs Lab 03/17/13 1422  WBC 7.3  NEUTROABS 4.5  HGB 13.2  HCT 40.8  MCV 94.9  PLT 181   Cardiac Enzymes: No results found for this basename: CKTOTAL, CKMB, CKMBINDEX, TROPONINI,  in the last 168 hours BNP (last 3 results) No results found for this basename: PROBNP,  in the last 8760 hours CBG: No results found for this basename: GLUCAP,  in the last 168 hours  Recent Results (from the past 240 hour(s))  SURGICAL PCR SCREEN     Status: None   Collection Time    03/18/13  7:08 AM      Result Value Range Status   MRSA, PCR NEGATIVE  NEGATIVE Final   Staphylococcus aureus NEGATIVE  NEGATIVE Final   Comment:            The Xpert SA Assay (FDA     approved for NASAL specimens     in patients over 28 years of age),     is one component of     a comprehensive surveillance     program.  Test performance has     been validated by [employer] for patients greater     than or equal to 44 year old.     It is not intended     to diagnose infection nor to     guide or monitor treatment.      Additional labs: 1. None     Studies: Dg Chest 1 View  03/17/2013   *RADIOLOGY REPORT*  Clinical Data: Preoperative examination (weakness, fall from wheelchair  CHEST - 1 VIEW  Comparison: 02/11/2012; 06/01/2011; Thoracic spine radiographs - earlier same day  Findings: Grossly unchanged enlarged cardiac silhouette and mediastinal contours with tortuosity and ectasia of the descending thoracic aorta.  The lungs appear hyperexpanded with flattening of bilateral main diaphragms mild diffuse slightly nodular thickening of the interstitium. Nodular opacities overlying the right mid lung are grossly unchanged.  No new focal airspace opacities.  No pleural effusion or pneumothorax.  No evidence of edema.  No acute osseous abnormality.  IMPRESSION: 1.  Hyperexpanded  lungs without acute cardiopulmonary disease. 2.  Nodular opacities overlying the right mid lung are grossly unchanged since the 2012 examination and thus favored to be a benign etiology.   Original Report Authenticated By: Tacey Ruiz, MD   Dg Thoracic Spine 2 View  03/17/2013   *RADIOLOGY REPORT*  Clinical Data: Fall from wheelchair with back pain.  Left hip pain.  THORACIC SPINE - 2 VIEW  Comparison: Chest film of 02/11/2012  Findings: AP view demonstrates mild convex left thoracic spine curvature.  Paraspinous contours grossly unremarkable.  Apparent right sided mediastinal soft tissue fullness is favored to be projectional and is less impressive on the dedicated chest radiograph.  2 lateral views.  These demonstrate mild loss of the upper and mid thoracic vertebral body height at 3 levels.  Felt to be grossly similar to 06/01/2011 lateral chest radiograph.  The lower thoracic spine is not well evaluated on  the first lateral view.  IMPRESSION: Mild thoracic compression deformities, felt to be similar to 06/01/2011.  Degradation, especially effecting the lower thoracic spine on the lateral view.   Original Report Authenticated By: Jeronimo Greaves, M.D.   Dg Lumbar Spine 2-3 Views  03/17/2013   *RADIOLOGY REPORT*  Clinical Data: Fall from wheelchair with back pain and left hip pain.  LUMBAR SPINE - 2-3 VIEW  Comparison: CT abdomen/pelvis 02/11/2012  Findings: There is diffuse decreased bone density.  There is spondylosis throughout the lumbar spine.  There is moderate disc space narrowing at the L4-5 and L5-L1 levels.  There is no change and mild grade 1 anterolisthesis of L5 on S1.  There is disc space narrowing at the T12-L1 level.  Facet arthropathy is present over the lower lumbar spine.  Orthopedic screw is seen over the right proximal femur. There is a suggestion of a displaced intertrochanteric fracture of the left hip incompletely visualized on this exam.  IMPRESSION: Spondylosis of the spine with  multilevel degenerative disc disease and mild grade 1 anterolisthesis of L5 on S1 unchanged.  No acute compression fracture.  Suggestion of a displaced left intertrochanteric hip fracture incompletely visualized on this exam.  Recommend clinical correlation and left hip films for further evaluation.   Original Report Authenticated By: Elberta Fortis, M.D.   Dg Hip Complete Left  03/17/2013   *RADIOLOGY REPORT*  Clinical Data: Fall from wheelchair.  LEFT HIP - COMPLETE 2+ VIEW  Comparison: None.  Findings: Examination demonstrates a displaced intertrochanteric fracture of the left hip.  The left lesser trochanter is displaced. There is mild symmetric degenerative change of the hips.  An intramedullary nail is present over the right femur with associated screw bridging the right femoral neck into the femoral head intact. There are two small metallic densities of the right pelvis.  IMPRESSION: Displaced left intertrochanteric hip fracture.   Original Report Authenticated By: Elberta Fortis, M.D.   Ct Head Wo Contrast  03/17/2013   *RADIOLOGY REPORT*  Clinical Data:  History of trauma from a fall.  CT HEAD WITHOUT CONTRAST CT CERVICAL SPINE WITHOUT CONTRAST  Technique:  Multidetector CT imaging of the head and cervical spine was performed following the standard protocol without intravenous contrast.  Multiplanar CT image reconstructions of the cervical spine were also generated.  Comparison:  Head CT and cervical spine CT 02/11/2012.  CT HEAD  Findings: Mild cerebral and cerebellar atrophy.  Extensive patchy and confluent areas of decreased attenuation throughout the deep and periventricular white matter of the cerebral hemispheres bilaterally, compatible with chronic microvascular ischemic disease.  A well-defined focus of low attenuation in the anterior limb of the left internal capsule is unchanged, compatible with an old lacunar infarction. No acute displaced skull fractures are identified.  No acute intracranial  abnormality.  Specifically, no evidence of acute post-traumatic intracranial hemorrhage, no definite regions of acute/subacute cerebral ischemia, no focal mass, mass effect, hydrocephalus or abnormal intra or extra-axial fluid collections.  The visualized paranasal sinuses and mastoids are well pneumatized.  IMPRESSION: 1.  No acute displaced skull fracture or evidence to suggest significant acute traumatic injury to the brain. 2.  Mild cerebral and cerebellar atrophy with extensive chronic microvascular ischemic changes and old lacunar infarction in the anterior limb of the left internal capsule, as above.  CT CERVICAL SPINE  Findings: No acute displaced fractures of the cervical spine. Alignment is anatomic.  Prevertebral soft tissues are normal. Multilevel degenerative disc disease, most severe at C2-C3 and C5-  C6.  Mild multilevel facet arthropathy.  Visualized portions of the lung apices are remarkable for bilateral apical pleuroparenchymal thickening, most compatible with scarring related to prior episodes of infection or inflammation.  IMPRESSION: 1.  No evidence of significant acute traumatic injury to the cervical spine. 2.  Multilevel degenerative disc disease and cervical spondylosis, as above.   Original Report Authenticated By: Trudie Reed, M.D.   Ct Cervical Spine Wo Contrast  03/17/2013   *RADIOLOGY REPORT*  Clinical Data:  History of trauma from a fall.  CT HEAD WITHOUT CONTRAST CT CERVICAL SPINE WITHOUT CONTRAST  Technique:  Multidetector CT imaging of the head and cervical spine was performed following the standard protocol without intravenous contrast.  Multiplanar CT image reconstructions of the cervical spine were also generated.  Comparison:  Head CT and cervical spine CT 02/11/2012.  CT HEAD  Findings: Mild cerebral and cerebellar atrophy.  Extensive patchy and confluent areas of decreased attenuation throughout the deep and periventricular white matter of the cerebral hemispheres  bilaterally, compatible with chronic microvascular ischemic disease.  A well-defined focus of low attenuation in the anterior limb of the left internal capsule is unchanged, compatible with an old lacunar infarction. No acute displaced skull fractures are identified.  No acute intracranial abnormality.  Specifically, no evidence of acute post-traumatic intracranial hemorrhage, no definite regions of acute/subacute cerebral ischemia, no focal mass, mass effect, hydrocephalus or abnormal intra or extra-axial fluid collections.  The visualized paranasal sinuses and mastoids are well pneumatized.  IMPRESSION: 1.  No acute displaced skull fracture or evidence to suggest significant acute traumatic injury to the brain. 2.  Mild cerebral and cerebellar atrophy with extensive chronic microvascular ischemic changes and old lacunar infarction in the anterior limb of the left internal capsule, as above.  CT CERVICAL SPINE  Findings: No acute displaced fractures of the cervical spine. Alignment is anatomic.  Prevertebral soft tissues are normal. Multilevel degenerative disc disease, most severe at C2-C3 and C5- C6.  Mild multilevel facet arthropathy.  Visualized portions of the lung apices are remarkable for bilateral apical pleuroparenchymal thickening, most compatible with scarring related to prior episodes of infection or inflammation.  IMPRESSION: 1.  No evidence of significant acute traumatic injury to the cervical spine. 2.  Multilevel degenerative disc disease and cervical spondylosis, as above.   Original Report Authenticated By: Trudie Reed, M.D.   Dg Femur Left Port  03/17/2013   *RADIOLOGY REPORT*  Clinical Data: History of fall complaining of left leg pain.  PORTABLE LEFT FEMUR - 2 VIEW  Comparison: No priors.  Findings: AP and lateral views of the left femur are limited by lack of visualization of the proximal femur (see separate dictation for left hip which demonstrates an intertrochanteric fracture). The  mid and distal third of the left femur appear intact without acute displaced fracture.  IMPRESSION: 1.  No acute displaced fracture of the mid or distal third of the left femur.  Intertrochanteric fracture of the left hip was not imaged, see report for a left hip radiograph 03/17/2013 for full description.   Original Report Authenticated By: Trudie Reed, M.D.   Dg Knee Left Port  03/17/2013   *RADIOLOGY REPORT*  Clinical Data: History of fall.  Left knee pain.  PORTABLE LEFT KNEE - 1-2 VIEW  Comparison: No priors.  Findings: Two views of the left knee demonstrate no acute displaced fracture, subluxation or dislocation.  There is joint space narrowing, subchondral sclerosis, subchondral cyst formation and osteophyte formation, most pronounced in the  patellofemoral compartment, compatible with osteoarthritis.  IMPRESSION: 1.  No acute radiographic abnormality of the left knee. 2.  Tricompartmental osteoarthritis, most severe in the patellofemoral compartment.   Original Report Authenticated By: Trudie Reed, M.D.        Scheduled Meds: . ARIPiprazole  2 mg Oral Daily  . aspirin  81 mg Oral Daily  . carbidopa-levodopa  1 tablet Oral TID  . divalproex  250 mg Oral Daily  . divalproex  500 mg Oral QHS  . docusate sodium  100 mg Oral BID  . donepezil  23 mg Oral QHS  . heparin  5,000 Units Subcutaneous Q8H  . levothyroxine  212.5 mcg Oral QAC breakfast  . memantine  10 mg Oral BID  . mirtazapine  15 mg Oral QHS  . sertraline  100 mg Oral QHS  . sucralfate  1 g Oral BID  . traMADol  50 mg Oral QHS   Continuous Infusions: . sodium chloride 75 mL/hr at 03/18/13 2952    Principal Problem:   Hip fracture, left Active Problems:   Dementia   Parkinsonian features   Fall at nursing home   Hypothyroidism   History of recurrent UTIs   Urinary bladder incontinence   Alteration in bowel elimination: incontinence    Time spent: 30 minutes.    [facility]  [facility] Pager [phone].   If 8PM-8AM, please contact night-coverage at [email or web address], password [facility] 03/18/2013, 11:12 AM  LOS: 1 day

## 2013-03-18 NOTE — Progress Notes (Signed)
Orders in EPIC for IV NS bolus per Dr. Roda Shutters.

## 2013-03-19 ENCOUNTER — Telehealth: Payer: Self-pay | Admitting: Neurology

## 2013-03-19 DIAGNOSIS — D62 Acute posthemorrhagic anemia: Secondary | ICD-10-CM

## 2013-03-19 LAB — BASIC METABOLIC PANEL
CO2: 27 mEq/L (ref 19–32)
Calcium: 7.9 mg/dL — ABNORMAL LOW (ref 8.4–10.5)
Glucose, Bld: 129 mg/dL — ABNORMAL HIGH (ref 70–99)
Sodium: 141 mEq/L (ref 135–145)

## 2013-03-19 LAB — CBC
Hemoglobin: 9 g/dL — ABNORMAL LOW (ref 12.0–15.0)
MCH: 31.3 pg (ref 26.0–34.0)
MCV: 96.9 fL (ref 78.0–100.0)
RBC: 2.88 MIL/uL — ABNORMAL LOW (ref 3.87–5.11)

## 2013-03-19 LAB — ALBUMIN: Albumin: 2.3 g/dL — ABNORMAL LOW (ref 3.5–5.2)

## 2013-03-19 LAB — PREALBUMIN: Prealbumin: 11 mg/dL — ABNORMAL LOW (ref 17.0–34.0)

## 2013-03-19 MED ORDER — HYDROMORPHONE HCL PF 1 MG/ML IJ SOLN: 0.5000 mg | INTRAMUSCULAR | Status: DC | PRN

## 2013-03-19 MED ORDER — ENSURE COMPLETE PO LIQD
237.0000 mL | Freq: Two times a day (BID) | ORAL | Status: DC
  Administered 2013-03-19: 237 mL via ORAL

## 2013-03-19 MED ORDER — ENSURE COMPLETE PO LIQD
237.0000 mL | Freq: Two times a day (BID) | ORAL | Status: DC
  Administered 2013-03-20 (×2): 237 mL via ORAL

## 2013-03-19 MED ORDER — SODIUM CHLORIDE 0.9 % IV BOLUS (SEPSIS)
500.0000 mL | Freq: Once | INTRAVENOUS | Status: AC
  Administered 2013-03-19: 500 mL via INTRAVENOUS

## 2013-03-19 NOTE — Progress Notes (Signed)
Clinical Social Work Department CLINICAL SOCIAL WORK PLACEMENT NOTE 03/19/2013  Patient:  Bonnie, Riley  Account Number:  [id number] Admit date:  03/17/2013  Clinical Social Worker:  Cori Razor, LCSW  Date/time:  03/19/2013 11:28 AM  Clinical Social Work is seeking post-discharge placement for this patient at the following level of care:   SKILLED NURSING   (*CSW will update this form in Epic as items are completed)     Patient/family provided with [facility] Department of Clinical Social Work's list of facilities offering this level of care within the geographic area requested by the patient (or if unable, by the patient's family).  03/19/2013  Patient/family informed of their freedom to choose among providers that offer the needed level of care, that participate in Medicare, Medicaid or managed care program needed by the patient, have an available bed and are willing to accept the patient.    Patient/family informed of [facility]' ownership interest in [facility], as well as of the fact that they are under no obligation to receive care at this facility.  PASARR submitted to [employer] on  PASARR number received from [employer] on 06/06/2011  FL2 transmitted to all facilities in geographic area requested by pt/family on  03/19/2013 FL2 transmitted to all facilities within larger geographic area on   Patient informed that his/her managed care company has contracts with or will negotiate with  certain facilities, including the following:     Patient/family informed of bed offers received:   Patient chooses bed at  Physician recommends and patient chooses bed at    Patient to be transferred to  on   Patient to be transferred to facility by   The following physician request were entered in Epic:   Additional Comments:

## 2013-03-19 NOTE — Progress Notes (Signed)
Subjective:  Patient is demented and does not interact appropriately.   Objective:   VITALS:   Filed Vitals:   03/19/13 0153 03/19/13 0547 03/19/13 1400 03/19/13 2059  BP: 102/48 104/57 118/52 105/59  Pulse: 91 67 73 68  Temp: 97.8 F (36.6 C) 98.4 F (36.9 C) 98.7 F (37.1 C) 97.9 F (36.6 C)  TempSrc: Oral Oral Oral Oral  Resp: 18 14 18 16   Height:      Weight:      SpO2: 92% 98% 98% 99%    Intact pulses distally Dorsiflexion/Plantar flexion intact Incision: dressing C/D/I and no drainage No cellulitis present Compartment soft   Lab Results  Component Value Date   WBC 9.9 03/19/2013   HGB 9.0* 03/19/2013   HCT 27.9* 03/19/2013   MCV 96.9 03/19/2013   PLT 129* 03/19/2013     Assessment/Plan: 1 Day Post-Op   Problem List Items Addressed This Visit   Dementia   Relevant Medications      donepezil (ARICEPT) 23 MG TABS tablet      donepezil (ARICEPT) tablet 23 mg      ARIPiprazole (ABILIFY) tablet 2 mg      carbidopa-levodopa (SINEMET IR) 25-100 MG per tablet immediate release 1 tablet      divalproex (DEPAKOTE SPRINKLE) capsule 500 mg      LORazepam (ATIVAN) tablet 0.5 mg      mirtazapine (REMERON) tablet 15 mg      sertraline (ZOLOFT) tablet 100 mg      memantine (NAMENDA) tablet 10 mg      divalproex (DEPAKOTE SPRINKLE) capsule 250 mg   Fall at nursing home   Hypothyroidism   History of recurrent UTIs   Urinary bladder incontinence   Alteration in bowel elimination: incontinence   Acute post-hemorrhagic anemia    Other Visit Diagnoses   Closed left hip fracture, initial encounter    -  Primary    Relevant Orders       Weight bearing as tolerated    Hip fracture, left, closed, with routine healing, subsequent encounter           Advance diet Up with therapy DVT ppx - ASA BID x 6 weeks Nutrition consult Labs ordered F/u 2 weeks Pain control Discharge planning   [name] 03/19/2013, 9:20 PM [phone]

## 2013-03-19 NOTE — Progress Notes (Signed)
Clinical Social Work Department BRIEF PSYCHOSOCIAL ASSESSMENT 03/19/2013  Patient:  Bonnie Riley, Bonnie Riley     Account Number:  [id number]     Admit date:  03/17/2013  Clinical Social Worker:  Candie Chroman  Date/Time:  03/19/2013 11:18 AM  Referred by:  Physician  Date Referred:  03/19/2013 Referred for  SNF Placement   Other Referral:   Interview type:  Family Other interview type:    PSYCHOSOCIAL DATA Living Status:  FACILITY Admitted from facility:  [facility] Level of care:  Assisted Living Primary support name:  Liley Rake Primary support relationship to patient:  CHILD, ADULT Degree of support available:   Supportive    CURRENT CONCERNS Current Concerns  Post-Acute Placement   Other Concerns:    SOCIAL WORK ASSESSMENT / PLAN Pt is an 79 yr old female admitted from [facility]. CSW spoke with pt's daughter to assist with d/c planning. Pt fell at ALF and fx left hip. Surgery done on 03/18/13. SNF needed prior to returning to ALF. SNF search initiated and bed offers to be provided to daughter this afternoon.   Assessment/plan status:  Psychosocial Support/Ongoing Assessment of Needs Other assessment/ plan:   Information/referral to community resources:   SNF list with bed offers to be provided.    PATIENT'S/FAMILY'S RESPONSE TO PLAN OF CARE: Pt sleeping soundly. Daughter states pt  has been to [facility] in past and would consider ST Rehab there following hospital d/c.   Cori Razor LCSW [phone]

## 2013-03-19 NOTE — Progress Notes (Signed)
INITIAL NUTRITION ASSESSMENT  DOCUMENTATION CODES Per approved criteria  -Not Applicable   INTERVENTION: - Ensure Complete BID - Nursing to continue to encourage increased PO when pt awake and alert - Will continue to monitor   NUTRITION DIAGNOSIS: Increased nutrient needs related to left hip surgery as evidenced by MD notes.   Goal: Pt to consume >90% of meals/supplements  Monitor:  Weights, labs, intake  Reason for Assessment: Consult for hip fracture protocol   79 y.o. female  Admitting Dx: Hip fracture, left  ASSESSMENT: Pt from nursing facility, admitted with fall from wheelchair to left side - found to have left hip fracture, had surgical repair 03/18/13. Pt with moderate dementia, asleep during visit and no family present. Nurse tech reports pt ate only 10% of breakfast this morning and likely would have eaten more if she would have been able to stay awake. Pt ate around 50% of lunch. Per weight trend, pt has been gaining a little weight over the past 4 months.   Height: Ht Readings from Last 1 Encounters:  03/17/13 5' (1.524 m)    Weight: Wt Readings from Last 1 Encounters:  03/17/13 141 lb (63.957 kg)    Ideal Body Weight: 100 lb  % Ideal Body Weight: 141%  Wt Readings from Last 10 Encounters:  03/17/13 141 lb (63.957 kg)  03/17/13 141 lb (63.957 kg)  11/21/12 134 lb (60.782 kg)  06/01/11 155 lb (70.308 kg)  06/01/11 154 lb 5.2 oz (70 kg)  06/01/11 155 lb (70.308 kg)    Usual Body Weight: 134 lb earlier this year  % Usual Body Weight: 105%  BMI:  Body mass index is 27.54 kg/(m^2).  Estimated Nutritional Needs: Kcal: 1350-1500 Protein: 65-75g Fluid: 1.3-1.5L/day  Skin: Left hip incision   Diet Order: General  EDUCATION NEEDS: -No education needs identified at this time   Intake/Output Summary (Last 24 hours) at 03/19/13 1550 Last data filed at 03/19/13 1400  Gross per 24 hour  Intake 3305.42 ml  Output    775 ml  Net 2530.42 ml     Last BM: 9/6   Labs:   Recent Labs Lab 03/17/13 1422 03/19/13 0356 03/19/13 1315  NA 139 141  --   K 4.0 4.2  --   CL 102 108  --   CO2 30 27  --   BUN 20 21  --   CREATININE 0.94 1.02  --   CALCIUM 9.4 7.9* 7.8*  GLUCOSE 90 129*  --     CBG (last 3)  No results found for this basename: GLUCAP,  in the last 72 hours  Scheduled Meds: . ARIPiprazole  2 mg Oral Daily  . aspirin EC  325 mg Oral BID  . carbidopa-levodopa  1 tablet Oral TID  . divalproex  250 mg Oral Daily  . divalproex  500 mg Oral QHS  . docusate sodium  100 mg Oral BID  . donepezil  23 mg Oral QHS  . levothyroxine  212.5 mcg Oral QAC breakfast  . memantine  10 mg Oral BID  . mirtazapine  15 mg Oral QHS  . sertraline  100 mg Oral QHS  . sucralfate  1 g Oral BID    Continuous Infusions: . sodium chloride 100 mL/hr at 03/19/13 1322    Past Medical History  Diagnosis Date  . Dementia   . Altered mental status   . Depression   . Hypothyroidism   . Gastritis   . Arthritis   . Migraines   .  History of recurrent UTIs   . Alteration in bowel elimination: incontinence   . Urinary bladder incontinence   . Dementia   . Hematoma     Hx of right shin hematoma    Past Surgical History  Procedure Laterality Date  . Tonsillectomy    . Femur im nail  06/01/2011    Procedure: INTRAMEDULLARY (IM) NAIL FEMORAL;  Surgeon: Eldred Manges;  Location: [facility];  Service: Orthopedics;  Laterality: Right;    Levon Hedger MS, RD, LDN [phone] Pager [phone] After Hours Pager

## 2013-03-19 NOTE — Progress Notes (Signed)
CARE MANAGEMENT NOTE 03/19/2013  Patient:  Bonnie Riley, Bonnie Riley   Account Number:  [id number]  Date Initiated:  03/19/2013  Documentation initiated by:  [facility]  Subjective/Objective Assessment:   79 year old female admitted after a fall and sustaining left hip fracture.     Action/Plan:   From [facility] but needs SNF at d/c.   Anticipated DC Date:  03/22/2013   Anticipated DC Plan:  SKILLED NURSING FACILITY  In-house referral  Clinical Social Worker      DC Planning Services  CM consult         Status of service:  In process, will continue to follow Medicare Important Message given?  NA - LOS <3 / Initial given by admissions  Per UR Regulation:  Reviewed for med. necessity/level of care/duration of stay  Algernon Huxley RN BSN   [phone]

## 2013-03-19 NOTE — Progress Notes (Signed)
Pt with low BP and minimal UO at 2am vitals check. NP on call paged, new orders received. Foley cath manipulated. Bladder scan= less than 30 mL. Closely monitoring, [employer], [place] 03/19/2013 2:36 AM

## 2013-03-19 NOTE — Progress Notes (Signed)
[facility] PROGRESS NOTE  Bonnie Riley ZOX:[id number] DOB: 1925/10/21 DOA: 03/17/2013 PCP: MAZZOCCHI, Rise Mu, MD  HPI/Brief narrative 79 y.o. female, a resident of [address] assisted living facility, with a past medical history of moderate dementia associated with behavioral abnormalities (agitation, anxiety and aggression), recurrent UTIs, hypothyroidism, bowel and bladder incontinence, depression, unsteady gait and falls, presented to the ED on 03/17/13 following a fall at the nursing facility today associated with left hip pain. X-rays revealed displaced left intertrochanteric hip fracture. Orthopedics was consulted and hospitalist admission was requested.  Assessment/Plan:  Displaced intertrochanteric left hip fracture  - Sustained after a mechanical fall.  - Placed Foley catheter secondary to bladder and bowel incontinence history. Will consider removing Foley catheter on 9/9.  - Patient underwent ORIF/intramedullary implant of left intertrochanteric hip fracture on 03/18/13 - Patient had some low urine output postoperatively on 9/7-improved after IV fluid bolus and continue maintenance fluids for additional 24 hours. - As per orthopedics, weight bearing as tolerated, aspirin 325 mg twice a day for DVT prophylaxis & outpatient followup with orthopedics in 2 weeks for staple removal. - SNF when stable.  Acute blood loss anemia - Hemoglobin dropped from 13.2 preop >9 today. - Minimal blood loss during surgery. - Follow CBC in a.m. and transfuse if hemoglobin less than 7 g per DL.  Moderate dementia with behavioral changes (agitation and anxiety)/depression  - Continue PTA medications and monitor closely.  - Fall precautions.  - Patient somnolent this morning. Likely from pain medications. Minimize pain medications as far as possible.  Hypothyroidism  - Continue home dose of Synthroid.   History of recurrent UTI's  - UA negative  History of unsteady gait and falls   - This is her second fall within a week. She will likely need higher level of care/SNF at discharge.   Right midlung nodules on chest x-ray  - Lifelong nonsmoker  - Said to be stable compared to prior comparison  - Outpatient follow up    DVT prophylaxis: Per orthopedics. Heparin held this morning for surgery. Lines/catheters:  PIV, Foley catheter Nutrition: Currently n.p.o.  Activity:  Bedrest. Postop activity per orthopedics. Code Status: DO NOT RESUSCITATE Family Communication: None Disposition Plan: Possibly SNF when stable.   Consultants:  Orthopedics  Procedures:  Foley catheter 9/6 >  Antibiotics:  None   Subjective: Mild pain it operated left hip site. Per nursing, improving urine output.  Objective: Filed Vitals:   03/18/13 1820 03/18/13 2205 03/19/13 0153 03/19/13 0547  BP: 96/69 109/49 102/48 104/57  Pulse: 89 101 91 67  Temp: 97.7 F (36.5 C) 97.9 F (36.6 C) 97.8 F (36.6 C) 98.4 F (36.9 C)  TempSrc: Oral Oral Oral Oral  Resp: 16 16 18 14   Height:      Weight:      SpO2: 93% 94% 92% 98%    Intake/Output Summary (Last 24 hours) at 03/19/13 1226 Last data filed at 03/19/13 1000  Gross per 24 hour  Intake 3005.42 ml  Output    775 ml  Net 2230.42 ml   Filed Weights   03/17/13 1805  Weight: 63.957 kg (141 lb)     Exam:  General exam: Moderately built and nourished female patient lying comfortably supine in bed. Respiratory system: Clear. No increased work of breathing. Cardiovascular system: S1 & S2 heard, RRR. No JVD, murmurs, gallops, clicks or pedal edema. Gastrointestinal system: Abdomen is nondistended, soft and nontender. Normal bowel sounds heard. Central nervous system: Somnolent but easily arousable and  oriented x1. No focal neurological deficits. Extremities: Symmetric 5 x 5 power. Limited mobility of left lower extremity secondary to pain in left hip. Dressing over left hip surgical site-clean and dry.   Data  Reviewed: Basic Metabolic Panel:  Recent Labs Lab 03/17/13 1422 03/19/13 0356  NA 139 141  K 4.0 4.2  CL 102 108  CO2 30 27  GLUCOSE 90 129*  BUN 20 21  CREATININE 0.94 1.02  CALCIUM 9.4 7.9*   Liver Function Tests: No results found for this basename: AST, ALT, ALKPHOS, BILITOT, PROT, ALBUMIN,  in the last 168 hours No results found for this basename: LIPASE, AMYLASE,  in the last 168 hours No results found for this basename: AMMONIA,  in the last 168 hours CBC:  Recent Labs Lab 03/17/13 1422 03/19/13 0356  WBC 7.3 9.9  NEUTROABS 4.5  --   HGB 13.2 9.0*  HCT 40.8 27.9*  MCV 94.9 96.9  PLT 181 129*   Cardiac Enzymes: No results found for this basename: CKTOTAL, CKMB, CKMBINDEX, TROPONINI,  in the last 168 hours BNP (last 3 results) No results found for this basename: PROBNP,  in the last 8760 hours CBG: No results found for this basename: GLUCAP,  in the last 168 hours  Recent Results (from the past 240 hour(s))  SURGICAL PCR SCREEN     Status: None   Collection Time    03/18/13  7:08 AM      Result Value Range Status   MRSA, PCR NEGATIVE  NEGATIVE Final   Staphylococcus aureus NEGATIVE  NEGATIVE Final   Comment:            The Xpert SA Assay (FDA     approved for NASAL specimens     in patients over 50 years of age),     is one component of     a comprehensive surveillance     program.  Test performance has     been validated by [employer] for patients greater     than or equal to 56 year old.     It is not intended     to diagnose infection nor to     guide or monitor treatment.      Additional labs: 1. None     Studies: Dg Chest 1 View  03/17/2013   *RADIOLOGY REPORT*  Clinical Data: Preoperative examination (weakness, fall from wheelchair  CHEST - 1 VIEW  Comparison: 02/11/2012; 06/01/2011; Thoracic spine radiographs - earlier same day  Findings: Grossly unchanged enlarged cardiac silhouette and mediastinal contours with tortuosity and  ectasia of the descending thoracic aorta.  The lungs appear hyperexpanded with flattening of bilateral main diaphragms mild diffuse slightly nodular thickening of the interstitium. Nodular opacities overlying the right mid lung are grossly unchanged.  No new focal airspace opacities.  No pleural effusion or pneumothorax.  No evidence of edema.  No acute osseous abnormality.  IMPRESSION: 1.  Hyperexpanded lungs without acute cardiopulmonary disease. 2.  Nodular opacities overlying the right mid lung are grossly unchanged since the 2012 examination and thus favored to be a benign etiology.   Original Report Authenticated By: Tacey Ruiz, MD   Dg Thoracic Spine 2 View  03/17/2013   *RADIOLOGY REPORT*  Clinical Data: Fall from wheelchair with back pain.  Left hip pain.  THORACIC SPINE - 2 VIEW  Comparison: Chest film of 02/11/2012  Findings: AP view demonstrates mild convex left thoracic spine curvature.  Paraspinous contours  grossly unremarkable.  Apparent right sided mediastinal soft tissue fullness is favored to be projectional and is less impressive on the dedicated chest radiograph.  2 lateral views.  These demonstrate mild loss of the upper and mid thoracic vertebral body height at 3 levels.  Felt to be grossly similar to 06/01/2011 lateral chest radiograph.  The lower thoracic spine is not well evaluated on the first lateral view.  IMPRESSION: Mild thoracic compression deformities, felt to be similar to 06/01/2011.  Degradation, especially effecting the lower thoracic spine on the lateral view.   Original Report Authenticated By: Jeronimo Greaves, M.D.   Dg Lumbar Spine 2-3 Views  03/17/2013   *RADIOLOGY REPORT*  Clinical Data: Fall from wheelchair with back pain and left hip pain.  LUMBAR SPINE - 2-3 VIEW  Comparison: CT abdomen/pelvis 02/11/2012  Findings: There is diffuse decreased bone density.  There is spondylosis throughout the lumbar spine.  There is moderate disc space narrowing at the L4-5 and L5-L1  levels.  There is no change and mild grade 1 anterolisthesis of L5 on S1.  There is disc space narrowing at the T12-L1 level.  Facet arthropathy is present over the lower lumbar spine.  Orthopedic screw is seen over the right proximal femur. There is a suggestion of a displaced intertrochanteric fracture of the left hip incompletely visualized on this exam.  IMPRESSION: Spondylosis of the spine with multilevel degenerative disc disease and mild grade 1 anterolisthesis of L5 on S1 unchanged.  No acute compression fracture.  Suggestion of a displaced left intertrochanteric hip fracture incompletely visualized on this exam.  Recommend clinical correlation and left hip films for further evaluation.   Original Report Authenticated By: Elberta Fortis, M.D.   Dg Hip Complete Left  03/17/2013   *RADIOLOGY REPORT*  Clinical Data: Fall from wheelchair.  LEFT HIP - COMPLETE 2+ VIEW  Comparison: None.  Findings: Examination demonstrates a displaced intertrochanteric fracture of the left hip.  The left lesser trochanter is displaced. There is mild symmetric degenerative change of the hips.  An intramedullary nail is present over the right femur with associated screw bridging the right femoral neck into the femoral head intact. There are two small metallic densities of the right pelvis.  IMPRESSION: Displaced left intertrochanteric hip fracture.   Original Report Authenticated By: Elberta Fortis, M.D.   Dg Femur Left  03/18/2013   *RADIOLOGY REPORT*  Clinical Data: Intramedullary nail left hip.  LEFT FEMUR - 2 VIEW  Comparison: 03/17/2013.  Findings: Five spot intraoperative fluoroscopic images over the left hip were obtained demonstrating placement of an intramedullary nail within the left femur with associated screw bridging patient's intertrochanteric fracture extending into the femoral head. Hardware is intact as there is anatomic alignment about the fracture site.  Recommend correlation with findings at the time of the  procedure.   Original Report Authenticated By: Elberta Fortis, M.D.   Ct Head Wo Contrast  03/17/2013   *RADIOLOGY REPORT*  Clinical Data:  History of trauma from a fall.  CT HEAD WITHOUT CONTRAST CT CERVICAL SPINE WITHOUT CONTRAST  Technique:  Multidetector CT imaging of the head and cervical spine was performed following the standard protocol without intravenous contrast.  Multiplanar CT image reconstructions of the cervical spine were also generated.  Comparison:  Head CT and cervical spine CT 02/11/2012.  CT HEAD  Findings: Mild cerebral and cerebellar atrophy.  Extensive patchy and confluent areas of decreased attenuation throughout the deep and periventricular white matter of the cerebral hemispheres bilaterally, compatible with  chronic microvascular ischemic disease.  A well-defined focus of low attenuation in the anterior limb of the left internal capsule is unchanged, compatible with an old lacunar infarction. No acute displaced skull fractures are identified.  No acute intracranial abnormality.  Specifically, no evidence of acute post-traumatic intracranial hemorrhage, no definite regions of acute/subacute cerebral ischemia, no focal mass, mass effect, hydrocephalus or abnormal intra or extra-axial fluid collections.  The visualized paranasal sinuses and mastoids are well pneumatized.  IMPRESSION: 1.  No acute displaced skull fracture or evidence to suggest significant acute traumatic injury to the brain. 2.  Mild cerebral and cerebellar atrophy with extensive chronic microvascular ischemic changes and old lacunar infarction in the anterior limb of the left internal capsule, as above.  CT CERVICAL SPINE  Findings: No acute displaced fractures of the cervical spine. Alignment is anatomic.  Prevertebral soft tissues are normal. Multilevel degenerative disc disease, most severe at C2-C3 and C5- C6.  Mild multilevel facet arthropathy.  Visualized portions of the lung apices are remarkable for bilateral apical  pleuroparenchymal thickening, most compatible with scarring related to prior episodes of infection or inflammation.  IMPRESSION: 1.  No evidence of significant acute traumatic injury to the cervical spine. 2.  Multilevel degenerative disc disease and cervical spondylosis, as above.   Original Report Authenticated By: Trudie Reed, M.D.   Ct Cervical Spine Wo Contrast  03/17/2013   *RADIOLOGY REPORT*  Clinical Data:  History of trauma from a fall.  CT HEAD WITHOUT CONTRAST CT CERVICAL SPINE WITHOUT CONTRAST  Technique:  Multidetector CT imaging of the head and cervical spine was performed following the standard protocol without intravenous contrast.  Multiplanar CT image reconstructions of the cervical spine were also generated.  Comparison:  Head CT and cervical spine CT 02/11/2012.  CT HEAD  Findings: Mild cerebral and cerebellar atrophy.  Extensive patchy and confluent areas of decreased attenuation throughout the deep and periventricular white matter of the cerebral hemispheres bilaterally, compatible with chronic microvascular ischemic disease.  A well-defined focus of low attenuation in the anterior limb of the left internal capsule is unchanged, compatible with an old lacunar infarction. No acute displaced skull fractures are identified.  No acute intracranial abnormality.  Specifically, no evidence of acute post-traumatic intracranial hemorrhage, no definite regions of acute/subacute cerebral ischemia, no focal mass, mass effect, hydrocephalus or abnormal intra or extra-axial fluid collections.  The visualized paranasal sinuses and mastoids are well pneumatized.  IMPRESSION: 1.  No acute displaced skull fracture or evidence to suggest significant acute traumatic injury to the brain. 2.  Mild cerebral and cerebellar atrophy with extensive chronic microvascular ischemic changes and old lacunar infarction in the anterior limb of the left internal capsule, as above.  CT CERVICAL SPINE  Findings: No acute  displaced fractures of the cervical spine. Alignment is anatomic.  Prevertebral soft tissues are normal. Multilevel degenerative disc disease, most severe at C2-C3 and C5- C6.  Mild multilevel facet arthropathy.  Visualized portions of the lung apices are remarkable for bilateral apical pleuroparenchymal thickening, most compatible with scarring related to prior episodes of infection or inflammation.  IMPRESSION: 1.  No evidence of significant acute traumatic injury to the cervical spine. 2.  Multilevel degenerative disc disease and cervical spondylosis, as above.   Original Report Authenticated By: Trudie Reed, M.D.   Dg Femur Left Port  03/17/2013   *RADIOLOGY REPORT*  Clinical Data: History of fall complaining of left leg pain.  PORTABLE LEFT FEMUR - 2 VIEW  Comparison: No priors.  Findings: AP and lateral views of the left femur are limited by lack of visualization of the proximal femur (see separate dictation for left hip which demonstrates an intertrochanteric fracture). The mid and distal third of the left femur appear intact without acute displaced fracture.  IMPRESSION: 1.  No acute displaced fracture of the mid or distal third of the left femur.  Intertrochanteric fracture of the left hip was not imaged, see report for a left hip radiograph 03/17/2013 for full description.   Original Report Authenticated By: Trudie Reed, M.D.   Dg Knee Left Port  03/17/2013   *RADIOLOGY REPORT*  Clinical Data: History of fall.  Left knee pain.  PORTABLE LEFT KNEE - 1-2 VIEW  Comparison: No priors.  Findings: Two views of the left knee demonstrate no acute displaced fracture, subluxation or dislocation.  There is joint space narrowing, subchondral sclerosis, subchondral cyst formation and osteophyte formation, most pronounced in the patellofemoral compartment, compatible with osteoarthritis.  IMPRESSION: 1.  No acute radiographic abnormality of the left knee. 2.  Tricompartmental osteoarthritis, most severe in  the patellofemoral compartment.   Original Report Authenticated By: Trudie Reed, M.D.   Dg C-arm 1-60 Min-no Report  03/18/2013   CLINICAL DATA: IM nail left hip   C-ARM 1-60 MINUTES  Fluoroscopy was utilized by the requesting physician.  No radiographic  interpretation.         Scheduled Meds: . ARIPiprazole  2 mg Oral Daily  . aspirin EC  325 mg Oral BID  . carbidopa-levodopa  1 tablet Oral TID  . divalproex  250 mg Oral Daily  . divalproex  500 mg Oral QHS  . docusate sodium  100 mg Oral BID  . donepezil  23 mg Oral QHS  . levothyroxine  212.5 mcg Oral QAC breakfast  . memantine  10 mg Oral BID  . mirtazapine  15 mg Oral QHS  . sertraline  100 mg Oral QHS  . sucralfate  1 g Oral BID   Continuous Infusions: . sodium chloride 100 mL/hr at 03/18/13 2211    Principal Problem:   Hip fracture, left Active Problems:   Dementia   Parkinsonian features   Fall at nursing home   Hypothyroidism   History of recurrent UTIs   Urinary bladder incontinence   Alteration in bowel elimination: incontinence    Time spent: 30 minutes.    [facility]  [facility] Pager [phone].   If 8PM-8AM, please contact night-coverage at [email or web address], password [facility] 03/19/2013, 12:26 PM  LOS: 2 days

## 2013-03-20 ENCOUNTER — Encounter (HOSPITAL_COMMUNITY): Payer: Self-pay | Admitting: Orthopaedic Surgery

## 2013-03-20 ENCOUNTER — Ambulatory Visit: Payer: Medicare Other | Admitting: Neurology

## 2013-03-20 LAB — CBC
Hemoglobin: 8.5 g/dL — ABNORMAL LOW (ref 12.0–15.0)
MCHC: 32.3 g/dL (ref 30.0–36.0)
Platelets: 133 10*3/uL — ABNORMAL LOW (ref 150–400)
RDW: 14.8 % (ref 11.5–15.5)

## 2013-03-20 LAB — BASIC METABOLIC PANEL
GFR calc Af Amer: 88 mL/min — ABNORMAL LOW (ref >90)
GFR calc non Af Amer: 76 mL/min — ABNORMAL LOW (ref >90)
Potassium: 3.6 mEq/L (ref 3.5–5.1)
Sodium: 138 mEq/L (ref 135–145)

## 2013-03-20 MED ORDER — LORAZEPAM 0.5 MG PO TABS: 0.5000 mg | ORAL_TABLET | Freq: Three times a day (TID) | ORAL | Status: AC | PRN

## 2013-03-20 MED ORDER — ENSURE COMPLETE PO LIQD: 237.0000 mL | Freq: Two times a day (BID) | ORAL | Status: DC

## 2013-03-20 NOTE — Discharge Summary (Signed)
Physician Discharge Summary  Bonnie Riley ZOX:[id number] DOB: 06-May-1926 DOA: 03/17/2013  PCP: Bonnie Coventry, MD  Admit date: 03/17/2013 Discharge date: 03/20/2013  Time spent: Greater than 30 minutes  Recommendations for Outpatient Follow-up:  1. Dr. Talmadge Riley, PCP 2. Dr. [name], Orthopedics in 2 weeks 3. Follow repeat labs (CBC) at SNF on 03/22/2013. 4. Recommend OP follow up of right lung nodules that were seen on CXR. 5. After patient completes 6 weeks of DVT prophylaxis with aspirin 325 mg twice a day (started 03/18/13), change to previous dose of 81 mg by mouth daily.  Discharge Diagnoses:  Principal Problem:   Hip fracture, left Active Problems:   Dementia   Parkinsonian features   Fall at nursing home   Hypothyroidism   History of recurrent UTIs   Urinary bladder incontinence   Alteration in bowel elimination: incontinence   Acute post-hemorrhagic anemia   Discharge Condition: Improved & Stable  Diet recommendation: Heart Healthy diet.  Filed Weights   03/17/13 1805  Weight: 63.957 kg (141 lb)    History of present illness:  79 y.o. female, a resident of [address] assisted living facility, with a past medical history of moderate dementia associated with behavioral abnormalities (agitation, anxiety and aggression), recurrent UTIs, hypothyroidism, bowel and bladder incontinence, depression, unsteady gait and falls, presented to the ED on 03/17/13 following a fall at the nursing facility today associated with left hip pain. X-rays revealed displaced left intertrochanteric hip fracture. Orthopedics was consulted and hospitalist admission was requested.  Hospital Course:   Displaced intertrochanteric left hip fracture  - Sustained after a mechanical fall.  - Placed Foley catheter secondary to bladder and bowel incontinence history. Removed on 9/9.  - Patient underwent ORIF/intramedullary implant of left intertrochanteric hip fracture on  03/18/13  - Patient had some low urine output postoperatively on 9/7- resolved after IV fluids.  - As per orthopedics, weight bearing as tolerated, aspirin 325 mg twice a day x 6 weeks for DVT prophylaxis & outpatient followup with orthopedics in 2 weeks for staple removal. Wound care as per orthopedics recommendations - Discussed with orthopedics-cleared for DC to SNF today.  Acute blood loss anemia/thrombocytopenia  - Hemoglobin dropped from 13.2 preop >9 >8.5.  - Minimal blood loss during surgery.  - Thrombocytopenia also likely secondary to this.  - Follow CBC on 03/22/13 and transfuse if hemoglobin less than 7 g per DL.  Moderate dementia with behavioral changes (agitation and anxiety)/depression  - Continue PTA medications. Patient did not have any significant problems with agitation. - Fall precautions.  - Minimize pain medications as far as possible.   Hypothyroidism  - Continue home dose of Synthroid.   History of recurrent UTI's  - UA negative   History of unsteady gait and falls  - This is her second fall within a week. Continue rehabilitation at SNF.  Right midlung nodules on chest x-ray  - Lifelong nonsmoker  - Said to be stable compared to prior comparison  - Outpatient follow up   Consultations:  Orthopedics  Procedures:  Foley catheter  Intramedullary implant of left intertrochanteric hip fracture on 03/18/13    Discharge Exam:  Complaints:  Mild left hip pain-worse with movements. Wants Foley removed. Denies any other complaints.  Filed Vitals:   03/19/13 0547 03/19/13 1400 03/19/13 2059 03/20/13 0554  BP: 104/57 118/52 105/59 126/82  Pulse: 67 73 68 71  Temp: 98.4 F (36.9 C) 98.7 F (37.1 C) 97.9 F (36.6 C) 98.2 F (36.8 C)  TempSrc: Oral Oral Oral Oral  Resp: 14 18 16 16   Height:      Weight:      SpO2: 98% 98% 99% 96%    General exam: Moderately built and nourished female patient lying comfortably supine in bed.  Respiratory system:  Clear. No increased work of breathing.  Cardiovascular system: S1 & S2 heard, RRR. No JVD, murmurs, gallops, clicks or pedal edema.  Gastrointestinal system: Abdomen is nondistended, soft and nontender. Normal bowel sounds heard.  Central nervous system: Alert and oriented x1. No focal neurological deficits.  Extremities: Symmetric 5 x 5 power. Limited mobility of left lower extremity secondary to pain in left hip. Dressing over left hip surgical site-clean and dry.   Discharge Instructions      Discharge Orders   Future Orders Complete By Expires   Call MD for:  difficulty breathing, headache or visual disturbances  As directed    Call MD for:  extreme fatigue  As directed    Call MD for:  persistant dizziness or light-headedness  As directed    Call MD for:  persistant nausea and vomiting  As directed    Call MD for:  redness, tenderness, or signs of infection (pain, swelling, redness, odor or green/yellow discharge around incision site)  As directed    Call MD for:  severe uncontrolled pain  As directed    Call MD for:  temperature >100.4  As directed    Diet - low sodium heart healthy  As directed    Increase activity slowly  As directed    Weight bearing as tolerated  As directed        Medication List    STOP taking these medications       aspirin 81 MG chewable tablet  Replaced by:  aspirin EC 325 MG tablet     ibuprofen 200 MG tablet  Commonly known as:  ADVIL,MOTRIN     traMADol 50 MG tablet  Commonly known as:  ULTRAM      TAKE these medications       ARIPiprazole 2 MG tablet  Commonly known as:  ABILIFY  Take 2 mg by mouth daily.     aspirin EC 325 MG tablet  Take 1 tablet (325 mg total) by mouth 2 (two) times daily.     CALTRATE 600 PLUS-VIT D PO  Take 600 mg by mouth daily.     carbidopa-levodopa 25-100 MG per tablet  Commonly known as:  SINEMET IR  Take 1 tablet by mouth 3 (three) times daily.     divalproex 125 MG capsule  Commonly known as:   DEPAKOTE SPRINKLE  Take 250-500 mg by mouth 2 (two) times daily. Take 250 mg in the am and 500 mg in the evening     donepezil 23 MG Tabs tablet  Commonly known as:  ARICEPT  Take 23 mg by mouth at bedtime.     feeding supplement Liqd  Take 237 mLs by mouth 2 (two) times daily between meals.     HYDROcodone-acetaminophen 5-325 MG per tablet  Commonly known as:  NORCO  Take 1 tablet by mouth every 6 (six) hours as needed for pain.     levothyroxine 200 MCG tablet  Commonly known as:  SYNTHROID, LEVOTHROID  Take 212.5 mcg by mouth daily before breakfast.     levothyroxine 25 MCG tablet  Commonly known as:  SYNTHROID, LEVOTHROID  Take 12.5 mcg by mouth daily before breakfast.     loperamide 2  MG capsule  Commonly known as:  IMODIUM  Take 2 mg by mouth 4 (four) times daily as needed. For loose stool     LORazepam 0.5 MG tablet  Commonly known as:  ATIVAN  Take 1 tablet (0.5 mg total) by mouth every 8 (eight) hours as needed for anxiety (agitation). For anxiety or agitation     mirtazapine 15 MG tablet  Commonly known as:  REMERON  Take 15 mg by mouth at bedtime.     multivitamin-lutein Caps capsule  Take 1 capsule by mouth daily.     NAMENDA XR 28 MG Cp24  Generic drug:  Memantine HCl ER  Take 1 capsule by mouth at bedtime.     sennosides-docusate sodium 8.6-50 MG tablet  Commonly known as:  SENOKOT-S  Take 2 tablets by mouth daily as needed. For constipation     sertraline 100 MG tablet  Commonly known as:  ZOLOFT  Take 100 mg by mouth at bedtime.     sucralfate 1 G tablet  Commonly known as:  CARAFATE  Take 1 g by mouth 2 (two) times daily.       Follow-up Information   Follow up with [name], MD In 2 weeks.   Specialty:  Orthopedic Surgery   Contact information:   [address] [place] [address] [phone]       Follow up with MAZZOCCHI, Rise Mu, MD. Schedule an appointment as soon as possible for a visit today.   Specialty:   Family Medicine   Contact information:   [address] Laurell [address] [place] [address] [phone]       Follow up with MD at Skilled Nursing Facility. Schedule an appointment as soon as possible for a visit in 2 days. [facility] repeat labs (CBC).)        The results of significant diagnostics from this hospitalization (including imaging, microbiology, ancillary and laboratory) are listed below for reference.    Significant Diagnostic Studies: Dg Chest 1 View  03/17/2013   *RADIOLOGY REPORT*  Clinical Data: Preoperative examination (weakness, fall from wheelchair  CHEST - 1 VIEW  Comparison: 02/11/2012; 06/01/2011; Thoracic spine radiographs - earlier same day  Findings: Grossly unchanged enlarged cardiac silhouette and mediastinal contours with tortuosity and ectasia of the descending thoracic aorta.  The lungs appear hyperexpanded with flattening of bilateral main diaphragms mild diffuse slightly nodular thickening of the interstitium. Nodular opacities overlying the right mid lung are grossly unchanged.  No new focal airspace opacities.  No pleural effusion or pneumothorax.  No evidence of edema.  No acute osseous abnormality.  IMPRESSION: 1.  Hyperexpanded lungs without acute cardiopulmonary disease. 2.  Nodular opacities overlying the right mid lung are grossly unchanged since the 2012 examination and thus favored to be a benign etiology.   Original Report Authenticated By: Tacey Ruiz, MD   Dg Thoracic Spine 2 View  03/17/2013   *RADIOLOGY REPORT*  Clinical Data: Fall from wheelchair with back pain.  Left hip pain.  THORACIC SPINE - 2 VIEW  Comparison: Chest film of 02/11/2012  Findings: AP view demonstrates mild convex left thoracic spine curvature.  Paraspinous contours grossly unremarkable.  Apparent right sided mediastinal soft tissue fullness is favored to be projectional and is less impressive on the dedicated chest radiograph.  2 lateral views.  These demonstrate mild loss of the  upper and mid thoracic vertebral body height at 3 levels.  Felt to be grossly similar to 06/01/2011 lateral chest radiograph.  The  lower thoracic spine is not well evaluated on the first lateral view.  IMPRESSION: Mild thoracic compression deformities, felt to be similar to 06/01/2011.  Degradation, especially effecting the lower thoracic spine on the lateral view.   Original Report Authenticated By: Jeronimo Greaves, M.D.   Dg Lumbar Spine 2-3 Views  03/17/2013   *RADIOLOGY REPORT*  Clinical Data: Fall from wheelchair with back pain and left hip pain.  LUMBAR SPINE - 2-3 VIEW  Comparison: CT abdomen/pelvis 02/11/2012  Findings: There is diffuse decreased bone density.  There is spondylosis throughout the lumbar spine.  There is moderate disc space narrowing at the L4-5 and L5-L1 levels.  There is no change and mild grade 1 anterolisthesis of L5 on S1.  There is disc space narrowing at the T12-L1 level.  Facet arthropathy is present over the lower lumbar spine.  Orthopedic screw is seen over the right proximal femur. There is a suggestion of a displaced intertrochanteric fracture of the left hip incompletely visualized on this exam.  IMPRESSION: Spondylosis of the spine with multilevel degenerative disc disease and mild grade 1 anterolisthesis of L5 on S1 unchanged.  No acute compression fracture.  Suggestion of a displaced left intertrochanteric hip fracture incompletely visualized on this exam.  Recommend clinical correlation and left hip films for further evaluation.   Original Report Authenticated By: Elberta Fortis, M.D.   Dg Hip Complete Left  03/17/2013   *RADIOLOGY REPORT*  Clinical Data: Fall from wheelchair.  LEFT HIP - COMPLETE 2+ VIEW  Comparison: None.  Findings: Examination demonstrates a displaced intertrochanteric fracture of the left hip.  The left lesser trochanter is displaced. There is mild symmetric degenerative change of the hips.  An intramedullary nail is present over the right femur with  associated screw bridging the right femoral neck into the femoral head intact. There are two small metallic densities of the right pelvis.  IMPRESSION: Displaced left intertrochanteric hip fracture.   Original Report Authenticated By: Elberta Fortis, M.D.   Dg Hip Complete Right  03/12/2013   *RADIOLOGY REPORT*  Clinical Data: Status post fall.  Right hip pain.  RIGHT HIP - COMPLETE 2+ VIEW  Comparison: Plain films right hip 06/01/2011.  Findings: Dynamic hip screw short IM nail for fixation of a healed intertrochanteric right femur fracture is identified.  There is no acute fracture.  No dislocation.  Mild degenerative change about the hips noted.  IMPRESSION:  1.  No acute finding. 2.  Healed right intertrochanteric fracture with fixation hardware in place. 3.  Mild bilateral hip degenerative disease.   Original Report Authenticated By: Holley Dexter, M.D.   Dg Femur Left  03/18/2013   *RADIOLOGY REPORT*  Clinical Data: Intramedullary nail left hip.  LEFT FEMUR - 2 VIEW  Comparison: 03/17/2013.  Findings: Five spot intraoperative fluoroscopic images over the left hip were obtained demonstrating placement of an intramedullary nail within the left femur with associated screw bridging patient's intertrochanteric fracture extending into the femoral head. Hardware is intact as there is anatomic alignment about the fracture site.  Recommend correlation with findings at the time of the procedure.   Original Report Authenticated By: Elberta Fortis, M.D.   Ct Head Wo Contrast  03/17/2013   *RADIOLOGY REPORT*  Clinical Data:  History of trauma from a fall.  CT HEAD WITHOUT CONTRAST CT CERVICAL SPINE WITHOUT CONTRAST  Technique:  Multidetector CT imaging of the head and cervical spine was performed following the standard protocol without intravenous contrast.  Multiplanar CT image reconstructions of the  cervical spine were also generated.  Comparison:  Head CT and cervical spine CT 02/11/2012.  CT HEAD  Findings: Mild  cerebral and cerebellar atrophy.  Extensive patchy and confluent areas of decreased attenuation throughout the deep and periventricular white matter of the cerebral hemispheres bilaterally, compatible with chronic microvascular ischemic disease.  A well-defined focus of low attenuation in the anterior limb of the left internal capsule is unchanged, compatible with an old lacunar infarction. No acute displaced skull fractures are identified.  No acute intracranial abnormality.  Specifically, no evidence of acute post-traumatic intracranial hemorrhage, no definite regions of acute/subacute cerebral ischemia, no focal mass, mass effect, hydrocephalus or abnormal intra or extra-axial fluid collections.  The visualized paranasal sinuses and mastoids are well pneumatized.  IMPRESSION: 1.  No acute displaced skull fracture or evidence to suggest significant acute traumatic injury to the brain. 2.  Mild cerebral and cerebellar atrophy with extensive chronic microvascular ischemic changes and old lacunar infarction in the anterior limb of the left internal capsule, as above.  CT CERVICAL SPINE  Findings: No acute displaced fractures of the cervical spine. Alignment is anatomic.  Prevertebral soft tissues are normal. Multilevel degenerative disc disease, most severe at C2-C3 and C5- C6.  Mild multilevel facet arthropathy.  Visualized portions of the lung apices are remarkable for bilateral apical pleuroparenchymal thickening, most compatible with scarring related to prior episodes of infection or inflammation.  IMPRESSION: 1.  No evidence of significant acute traumatic injury to the cervical spine. 2.  Multilevel degenerative disc disease and cervical spondylosis, as above.   Original Report Authenticated By: Trudie Reed, M.D.   Ct Cervical Spine Wo Contrast  03/17/2013   *RADIOLOGY REPORT*  Clinical Data:  History of trauma from a fall.  CT HEAD WITHOUT CONTRAST CT CERVICAL SPINE WITHOUT CONTRAST  Technique:   Multidetector CT imaging of the head and cervical spine was performed following the standard protocol without intravenous contrast.  Multiplanar CT image reconstructions of the cervical spine were also generated.  Comparison:  Head CT and cervical spine CT 02/11/2012.  CT HEAD  Findings: Mild cerebral and cerebellar atrophy.  Extensive patchy and confluent areas of decreased attenuation throughout the deep and periventricular white matter of the cerebral hemispheres bilaterally, compatible with chronic microvascular ischemic disease.  A well-defined focus of low attenuation in the anterior limb of the left internal capsule is unchanged, compatible with an old lacunar infarction. No acute displaced skull fractures are identified.  No acute intracranial abnormality.  Specifically, no evidence of acute post-traumatic intracranial hemorrhage, no definite regions of acute/subacute cerebral ischemia, no focal mass, mass effect, hydrocephalus or abnormal intra or extra-axial fluid collections.  The visualized paranasal sinuses and mastoids are well pneumatized.  IMPRESSION: 1.  No acute displaced skull fracture or evidence to suggest significant acute traumatic injury to the brain. 2.  Mild cerebral and cerebellar atrophy with extensive chronic microvascular ischemic changes and old lacunar infarction in the anterior limb of the left internal capsule, as above.  CT CERVICAL SPINE  Findings: No acute displaced fractures of the cervical spine. Alignment is anatomic.  Prevertebral soft tissues are normal. Multilevel degenerative disc disease, most severe at C2-C3 and C5- C6.  Mild multilevel facet arthropathy.  Visualized portions of the lung apices are remarkable for bilateral apical pleuroparenchymal thickening, most compatible with scarring related to prior episodes of infection or inflammation.  IMPRESSION: 1.  No evidence of significant acute traumatic injury to the cervical spine. 2.  Multilevel degenerative disc  disease  and cervical spondylosis, as above.   Original Report Authenticated By: Trudie Reed, M.D.   Dg Femur Left Port  03/17/2013   *RADIOLOGY REPORT*  Clinical Data: History of fall complaining of left leg pain.  PORTABLE LEFT FEMUR - 2 VIEW  Comparison: No priors.  Findings: AP and lateral views of the left femur are limited by lack of visualization of the proximal femur (see separate dictation for left hip which demonstrates an intertrochanteric fracture). The mid and distal third of the left femur appear intact without acute displaced fracture.  IMPRESSION: 1.  No acute displaced fracture of the mid or distal third of the left femur.  Intertrochanteric fracture of the left hip was not imaged, see report for a left hip radiograph 03/17/2013 for full description.   Original Report Authenticated By: Trudie Reed, M.D.   Dg Knee Left Port  03/17/2013   *RADIOLOGY REPORT*  Clinical Data: History of fall.  Left knee pain.  PORTABLE LEFT KNEE - 1-2 VIEW  Comparison: No priors.  Findings: Two views of the left knee demonstrate no acute displaced fracture, subluxation or dislocation.  There is joint space narrowing, subchondral sclerosis, subchondral cyst formation and osteophyte formation, most pronounced in the patellofemoral compartment, compatible with osteoarthritis.  IMPRESSION: 1.  No acute radiographic abnormality of the left knee. 2.  Tricompartmental osteoarthritis, most severe in the patellofemoral compartment.   Original Report Authenticated By: Trudie Reed, M.D.   Dg C-arm 1-60 Min-no Report  03/18/2013   CLINICAL DATA: IM nail left hip   C-ARM 1-60 MINUTES  Fluoroscopy was utilized by the requesting physician.  No radiographic  interpretation.     Microbiology: Recent Results (from the past 240 hour(s))  SURGICAL PCR SCREEN     Status: None   Collection Time    03/18/13  7:08 AM      Result Value Range Status   MRSA, PCR NEGATIVE  NEGATIVE Final   Staphylococcus aureus NEGATIVE   NEGATIVE Final   Comment:            The Xpert SA Assay (FDA     approved for NASAL specimens     in patients over 72 years of age),     is one component of     a comprehensive surveillance     program.  Test performance has     been validated by [employer] for patients greater     than or equal to 105 year old.     It is not intended     to diagnose infection nor to     guide or monitor treatment.     Labs: Basic Metabolic Panel:  Recent Labs Lab 03/17/13 1422 03/19/13 0356 03/19/13 1315 03/20/13 0416  NA 139 141  --  138  K 4.0 4.2  --  3.6  CL 102 108  --  107  CO2 30 27  --  28  GLUCOSE 90 129*  --  99  BUN 20 21  --  17  CREATININE 0.94 1.02  --  0.73  CALCIUM 9.4 7.9* 7.8* 8.0*   Liver Function Tests:  Recent Labs Lab 03/19/13 1315  ALBUMIN 2.3*   No results found for this basename: LIPASE, AMYLASE,  in the last 168 hours No results found for this basename: AMMONIA,  in the last 168 hours CBC:  Recent Labs Lab 03/17/13 1422 03/19/13 0356 03/20/13 0416  WBC 7.3 9.9 8.7  NEUTROABS 4.5  --   --  HGB 13.2 9.0* 8.5*  HCT 40.8 27.9* 26.3*  MCV 94.9 96.9 96.0  PLT 181 129* 133*   Cardiac Enzymes: No results found for this basename: CKTOTAL, CKMB, CKMBINDEX, TROPONINI,  in the last 168 hours BNP: BNP (last 3 results) No results found for this basename: PROBNP,  in the last 8760 hours CBG: No results found for this basename: GLUCAP,  in the last 168 hours  Additional labs: 1. Prealbumin: 11    Signed:  Fahad Cisse  [facility] 03/20/2013, 12:54 PM

## 2013-03-20 NOTE — Progress Notes (Signed)
Subjective:  Patient is more alert today. No events  Objective:   VITALS:   Filed Vitals:   03/19/13 0547 03/19/13 1400 03/19/13 2059 03/20/13 0554  BP: 104/57 118/52 105/59 126/82  Pulse: 67 73 68 71  Temp: 98.4 F (36.9 C) 98.7 F (37.1 C) 97.9 F (36.6 C) 98.2 F (36.8 C)  TempSrc: Oral Oral Oral Oral  Resp: 14 18 16 16   Height:      Weight:      SpO2: 98% 98% 99% 96%    Intact pulses distally Dorsiflexion/Plantar flexion intact Incision: dressing C/D/I and no drainage No cellulitis present Compartment soft   Lab Results  Component Value Date   WBC 8.7 03/20/2013   HGB 8.5* 03/20/2013   HCT 26.3* 03/20/2013   MCV 96.0 03/20/2013   PLT 133* 03/20/2013     Assessment/Plan: 2 Days Post-Op   Problem List Items Addressed This Visit   Dementia   Relevant Medications      donepezil (ARICEPT) 23 MG TABS tablet      donepezil (ARICEPT) tablet 23 mg      ARIPiprazole (ABILIFY) tablet 2 mg      carbidopa-levodopa (SINEMET IR) 25-100 MG per tablet immediate release 1 tablet      divalproex (DEPAKOTE SPRINKLE) capsule 500 mg      LORazepam (ATIVAN) tablet 0.5 mg      mirtazapine (REMERON) tablet 15 mg      sertraline (ZOLOFT) tablet 100 mg      memantine (NAMENDA) tablet 10 mg      divalproex (DEPAKOTE SPRINKLE) capsule 250 mg   Fall at nursing home   Hypothyroidism   History of recurrent UTIs   Urinary bladder incontinence   Alteration in bowel elimination: incontinence   Acute post-hemorrhagic anemia    Other Visit Diagnoses   Closed left hip fracture, initial encounter    -  Primary    Relevant Orders       Weight bearing as tolerated    Hip fracture, left, closed, with routine healing, subsequent encounter           Advance diet Up with therapy DVT ppx - ASA BID x 6 weeks Nutrition - ensure complete BID F/u 2 weeks Pain control Discharge planning   [name] 03/20/2013, 8:24 AM [phone]

## 2013-03-20 NOTE — Progress Notes (Signed)
CSW assisting with d/c planning. Daughter has chosen [facility] for rehab following hospital d/c. Clovis Cao / DNR in chart for MD signature. CSW will continue to follow to assist with d/c planning.  Cori Razor LCSW [phone]

## 2013-03-20 NOTE — Progress Notes (Signed)
Report called to [facility], spoke with Scarlette Calico, RN. Pt has tolerating meals today and has been very pleasant. She has had several incontinent episodes post catheter removal.

## 2013-03-21 ENCOUNTER — Non-Acute Institutional Stay (SKILLED_NURSING_FACILITY): Payer: Medicare Other | Admitting: Adult Health

## 2013-03-21 DIAGNOSIS — S72009D Fracture of unspecified part of neck of unspecified femur, subsequent encounter for closed fracture with routine healing: Secondary | ICD-10-CM

## 2013-03-21 DIAGNOSIS — R259 Unspecified abnormal involuntary movements: Secondary | ICD-10-CM

## 2013-03-21 DIAGNOSIS — F329 Major depressive disorder, single episode, unspecified: Secondary | ICD-10-CM

## 2013-03-21 DIAGNOSIS — K219 Gastro-esophageal reflux disease without esophagitis: Secondary | ICD-10-CM

## 2013-03-21 DIAGNOSIS — E039 Hypothyroidism, unspecified: Secondary | ICD-10-CM

## 2013-03-21 DIAGNOSIS — S72002D Fracture of unspecified part of neck of left femur, subsequent encounter for closed fracture with routine healing: Secondary | ICD-10-CM

## 2013-03-21 DIAGNOSIS — F0390 Unspecified dementia without behavioral disturbance: Secondary | ICD-10-CM

## 2013-03-21 NOTE — Progress Notes (Signed)
Clinical Social Work Department CLINICAL SOCIAL WORK PLACEMENT NOTE 03/21/2013  Patient:  Bonnie Riley, Bonnie Riley  Account Number:  [id number] Admit date:  03/17/2013  Clinical Social Worker:  Cori Razor, LCSW  Date/time:  03/19/2013 11:28 AM  Clinical Social Work is seeking post-discharge placement for this patient at the following level of care:   SKILLED NURSING   (*CSW will update this form in Epic as items are completed)   03/21/2013  Patient/family provided with [facility] Department of Clinical Social Work's list of facilities offering this level of care within the geographic area requested by the patient (or if unable, by the patient's family).  03/19/2013  Patient/family informed of their freedom to choose among providers that offer the needed level of care, that participate in Medicare, Medicaid or managed care program needed by the patient, have an available bed and are willing to accept the patient.    Patient/family informed of [facility]' ownership interest in [facility], as well as of the fact that they are under no obligation to receive care at this facility.  PASARR submitted to EDS on  PASARR number received from [employer] on 06/06/2011  FL2 transmitted to all facilities in geographic area requested by pt/family on  03/19/2013 FL2 transmitted to all facilities within larger geographic area on   Patient informed that his/her managed care company has contracts with or will negotiate with  certain facilities, including the following:     Patient/family informed of bed offers received:  03/19/2013 Patient chooses bed at [facility] Physician recommends and patient chooses bed at    Patient to be transferred to [facility] on  03/20/2013 Patient to be transferred to facility by [facility]  The following physician request were entered in Epic:   Additional Comments:  Cori Razor LCSW [phone]

## 2013-03-22 ENCOUNTER — Non-Acute Institutional Stay (SKILLED_NURSING_FACILITY): Payer: Medicare Other | Admitting: Internal Medicine

## 2013-03-22 DIAGNOSIS — G20 Parkinson's disease: Secondary | ICD-10-CM

## 2013-03-22 DIAGNOSIS — F0390 Unspecified dementia without behavioral disturbance: Secondary | ICD-10-CM

## 2013-03-22 DIAGNOSIS — E039 Hypothyroidism, unspecified: Secondary | ICD-10-CM

## 2013-03-22 DIAGNOSIS — S72002S Fracture of unspecified part of neck of left femur, sequela: Secondary | ICD-10-CM

## 2013-03-22 DIAGNOSIS — S72009S Fracture of unspecified part of neck of unspecified femur, sequela: Secondary | ICD-10-CM

## 2013-03-23 ENCOUNTER — Other Ambulatory Visit: Payer: Self-pay | Admitting: *Deleted

## 2013-03-23 LAB — VITAMIN D 1,25 DIHYDROXY
Vitamin D 1, 25 (OH)2 Total: 14 pg/mL — ABNORMAL LOW (ref 18–72)
Vitamin D2 1, 25 (OH)2: <8 pg/mL
Vitamin D3 1, 25 (OH)2: 14 pg/mL

## 2013-03-23 MED ORDER — HYDROCODONE-ACETAMINOPHEN 5-325 MG PO TABS: ORAL_TABLET | ORAL | Status: DC

## 2013-04-12 ENCOUNTER — Non-Acute Institutional Stay (SKILLED_NURSING_FACILITY): Payer: Medicare Other | Admitting: Adult Health

## 2013-04-12 DIAGNOSIS — E876 Hypokalemia: Secondary | ICD-10-CM

## 2013-04-18 DIAGNOSIS — G20A1 Parkinson's disease without dyskinesia, without mention of fluctuations: Secondary | ICD-10-CM | POA: Insufficient documentation

## 2013-04-18 DIAGNOSIS — G20 Parkinson's disease: Secondary | ICD-10-CM | POA: Insufficient documentation

## 2013-04-18 NOTE — Progress Notes (Signed)
Patient ID: Bonnie Riley, female   DOB: 1926-03-31, 79 y.o.   MRN: [id number]        HISTORY & PHYSICAL  DATE: 03/22/2013   FACILITY: [facility]  LEVEL OF CARE: SNF (31)  ALLERGIES:  No Known Allergies  CHIEF COMPLAINT:  Manage left hip fracture, dementia, and hypothyroidism.     HISTORY OF PRESENT ILLNESS:  The patient is an 79 year-old, Caucasian female.    HIP FRACTURE: The patient had a mechanical fall and sustained a femur fracture.  Patient subsequently underwent surgical repair and tolerated the procedure well. Patient is admitted to this facility for short-term rehabilitation. Staff denies hip pain currently. No complications reported from the pain medications currently being used.  Patient is a poor historian.    DEMENTIA: Advanced.  The dementia remains stable and continues to function adequately in the current living environment with supervision.  The patient has had little changes in behavior. No complications noted from the medications presently being used.  Patient is a poor historian.    HYPOTHYROIDISM: The hypothyroidism remains stable. No complications noted from the medications presently being used.  The staff denies fatigue or constipation.  Last TSH:  Not available.     PAST MEDICAL HISTORY :  Past Medical History  Diagnosis Date  . Dementia   . Altered mental status   . Depression   . Hypothyroidism   . Gastritis   . Arthritis   . Migraines   . History of recurrent UTIs   . Alteration in bowel elimination: incontinence   . Urinary bladder incontinence   . Dementia   . Hematoma     Hx of right shin hematoma    PAST SURGICAL HISTORY: Past Surgical History  Procedure Laterality Date  . Tonsillectomy    . Femur im nail  06/01/2011    Procedure: INTRAMEDULLARY (IM) NAIL FEMORAL;  Surgeon: Eldred Manges;  Location: [facility];  Service: Orthopedics;  Laterality: Right;  . Intramedullary (im) nail intertrochanteric Left 03/18/2013     Procedure: INTRAMEDULLARY (IM) NAIL INTERTROCHANTRIC;  Surgeon: [name], MD;  Location: [facility];  Service: Orthopedics;  Laterality: Left;    SOCIAL HISTORY:  reports that she has never smoked. She has never used smokeless tobacco. She reports that she does not drink alcohol or use illicit drugs.  FAMILY HISTORY:  Family History  Problem Relation Age of Onset  . Arthritis Father     CURRENT MEDICATIONS: Reviewed per [facility]  REVIEW OF SYSTEMS:  Unobtainable.  Patient is a poor historian.                   PHYSICAL EXAMINATION  VS:  T 97.4       P 83      RR 16      BP 158/83      POX 96% room air        WT (Lb)  GENERAL: no acute distress, moderately obese body habitus EYES: conjunctivae normal, sclerae normal, normal eye lids MOUTH/THROAT: lips without lesions,no lesions in the mouth,tongue is without lesions,uvula elevates in midline NECK: supple, trachea midline, no neck masses, no thyroid tenderness, no thyromegaly LYMPHATICS: no LAN in the neck, no supraclavicular LAN RESPIRATORY: breathing is even & unlabored, BS CTAB CARDIAC: RRR, no murmur,no extra heart sounds, no edema GI:  ABDOMEN: abdomen soft, normal BS, no masses, no tenderness  LIVER/SPLEEN: no hepatomegaly, no splenomegaly MUSCULOSKELETAL: HEAD: normal to inspection & palpation BACK: no kyphosis,  scoliosis or spinal processes tenderness EXTREMITIES: LEFT UPPER EXTREMITY: strength decreased, range of motion moderate   RIGHT UPPER EXTREMITY: strength decreased, range of motion moderate   LEFT LOWER EXTREMITY: strength and range of motion unable to assess   RIGHT LOWER EXTREMITY: strength and range of motion unable to assess   PSYCHIATRIC: the patient is alert, unable to assess orientation, affect & mood appropriate  LABS/RADIOLOGY: Chest x-ray:  No acute disease.    Thoracic spine x-ray:  No acute findings.    Lumbar spine x-ray:  No acute findings.  Left displaced intertrochanteric hip fracture.     Left hip x-ray:   Showed left intertrochanteric hip fracture, displaced.      Right hip x-ray:  Did not show any acute findings.    CT of the head:  No acute findings.    CT of the cervical spine:  No acute injury.    Left knee x-ray:  No acute findings.    MRSA by PCR negative.     Staph aureus by PCR negative.    BMP normal.    Albumin 2.3.    Hemoglobin 8.5. MCV 96, platelets 133, WBC 8.7.    ASSESSMENT/PLAN:  Left hip fracture.  Status post ORIF.  Continue rehabilitation.    Dementia.  Advanced.    Hypothyroidism.  Continue levothyroxine.     Parkinson's disease.  Continue Sinemet.    Acute blood loss anemia.  Reassess hemoglobin level.    Depression.  Continue Zoloft.    Check CBC and BMP.    Elevated blood pressure.  We will review a log.    I have reviewed patient's medical records received at admission/from hospitalization.  CPT CODE: 16109

## 2013-05-15 ENCOUNTER — Non-Acute Institutional Stay (SKILLED_NURSING_FACILITY): Payer: Medicare Other | Admitting: Adult Health

## 2013-05-15 DIAGNOSIS — F0390 Unspecified dementia without behavioral disturbance: Secondary | ICD-10-CM

## 2013-05-15 DIAGNOSIS — R259 Unspecified abnormal involuntary movements: Secondary | ICD-10-CM

## 2013-05-15 DIAGNOSIS — E876 Hypokalemia: Secondary | ICD-10-CM | POA: Insufficient documentation

## 2013-05-15 DIAGNOSIS — K219 Gastro-esophageal reflux disease without esophagitis: Secondary | ICD-10-CM | POA: Insufficient documentation

## 2013-05-15 DIAGNOSIS — F329 Major depressive disorder, single episode, unspecified: Secondary | ICD-10-CM

## 2013-05-15 DIAGNOSIS — S72009D Fracture of unspecified part of neck of unspecified femur, subsequent encounter for closed fracture with routine healing: Secondary | ICD-10-CM

## 2013-05-15 DIAGNOSIS — S72002D Fracture of unspecified part of neck of left femur, subsequent encounter for closed fracture with routine healing: Secondary | ICD-10-CM

## 2013-05-15 DIAGNOSIS — E039 Hypothyroidism, unspecified: Secondary | ICD-10-CM

## 2013-05-15 NOTE — Progress Notes (Signed)
Patient ID: Bonnie Riley, female   DOB: 1925/09/28, 79 y.o.   MRN: [id number]       PROGRESS NOTE  DATE: 04/12/2013  FACILITY:     [facility]  LEVEL OF CARE:   SNF (31)  Acute Visit  CHIEF COMPLAINT:  Manage Hypokalemia  HISTORY OF PRESENT ILLNESS:  This is an 79 year old female who has been spitting out food on several occasions due to behavior issues. It was noted that her K 3.0. Patient is alert to self only.   PAST MEDICAL HISTORY : Reviewed.  No changes.  CURRENT MEDICATIONS: Reviewed per [facility]  REVIEW OF SYSTEMS:  GENERAL: no change in appetite, no fatigue, no weight changes, no fever, chills or weakness RESPIRATORY: no cough, SOB, DOE,, wheezing, hemoptysis CARDIAC: no chest pain, edema or palpitations GI: no abdominal pain, diarrhea, constipation, heart burn, nausea or vomiting  PHYSICAL EXAMINATION  VS:  T98.1        P87       RR18       BP112/75      POX96 %       WT139.4 (Lb)  GENERAL: no acute distress, normal body habitus LYMPHATICS: no LAN in the neck, no supraclavicular LAN RESPIRATORY: breathing is even & unlabored, BS CTAB CARDIAC: RRR, no murmur,no extra heart sounds, no edema GI: abdomen soft, normal BS, no masses, no tenderness, no hepatomegaly, no splenomegaly PSYCHIATRIC: the patient is alert & oriented to person, affect & behavior appropriate  LABS/RADIOLOGY: 04/11/13 sodium 141 potassium 3.0 glucose 82 BUN 14 creatinine 0.8 calcium 9.2 WBC 6.9 hemoglobin 12.4 hematocrit 40.1 03/23/13 sodium 142 potassium 3.4 glucose 103 BUN 13 creatinine 0.6 calcium 8.3 WBC 9.9 hemoglobin 9.3 hematocrit 29.8 03/21/13 WBC 8.7 hemoglobin 8.8 hematocrit 28.4 sodium 142 potassium 3.4 glucose 94 BUN 11 creatinine 0.7 calcium 8.3 03/20/13 sodium 138 potassium 3.6 glucose 99 BUN 17 creatinine 0.73 calcium 8.0 WBC 8.7 hemoglobin 8.5 hematocrit 26.3  ASSESSMENT/PLAN:  Hypokalemia  - start KCL 20 meq 1 tab PO Q D; BMP in 1 week  CPT CODE: 78295

## 2013-05-15 NOTE — Progress Notes (Addendum)
Patient ID: Bonnie Riley, female   DOB: 05/06/1926, 79 y.o.   MRN: [id number]       PROGRESS NOTE  DATE: 03/21/2013  FACILITY: Nursing Home Location: [facility]  LEVEL OF CARE: SNF (31)  Acute Visit  CHIEF COMPLAINT:  Follow-up hospitalization  HISTORY OF PRESENT ILLNESS: This is an 79 year old female who has been admitted to [facility] on 03/20/13 from [facility]. She fell in a nursing facility and sustained a left intertrochanteric hip fracture and had ORIF/intramedullary implant. She has been admitted for a short-term rehabilitation.  REASSESSMENT OF ONGOING PROBLEM(S):  DEMENTIA: The dementia remaines stable and continues to function adequately in the current living environment with supervision.  The patient has had little changes in behavior. No complications noted from the medications presently being used.  PARKINSON'S DISEASE: pt's Parkinson's disease is stable.  Denies progression of sx recently.  Pt is tolerating Parkinson's disease medications without any complications.  GERD: pt's GERD is stable.  Denies ongoing heartburn, abd. Pain, nausea or vomiting.  Currently on a PPI & tolerates it without any adverse reactions.  PAST MEDICAL HISTORY : Reviewed.  No changes.  CURRENT MEDICATIONS: Reviewed per [facility]  REVIEW OF SYSTEMS:  GENERAL: no change in appetite, no fatigue, no weight changes, no fever, chills or weakness RESPIRATORY: no cough, SOB, DOE, wheezing, hemoptysis CARDIAC: no chest pain, edema or palpitations GI: no abdominal pain, diarrhea, constipation, heart burn, nausea or vomiting  PHYSICAL EXAMINATION  VS:  T96.7       P81      RR18      BP128/64     POX93 %     WT151.8 (Lb)  GENERAL: no acute distress, normal body habitus EYES: conjunctivae normal, sclerae normal, normal eye lids NECK: supple, trachea midline, no neck masses, no thyroid tenderness, no thyromegaly LYMPHATICS: no LAN in the neck, no supraclavicular  LAN RESPIRATORY: breathing is even & unlabored, BS CTAB CARDIAC: RRR, no murmur,no extra heart sounds, no edema GI: abdomen soft, normal BS, no masses, no tenderness, no hepatomegaly, no splenomegaly PSYCHIATRIC: the patient is alert & oriented to person, affect & behavior appropriate  LABS/RADIOLOGY: 03/20/13 sodium 138 potassium 3.6 glucose 99 BUN 17 creatinine 0.73 calcium 8.0 WBC 8.7 hemoglobin 8.5 hematocrit 26.3    ASSESSMENT/PLAN:  Left intertrochanteric hip fracture S/P ORIF for rehabilitation  Dementia with agitation - continue Aricept and Namenda  Hypothyroidism - continue Synthroid  Major Depression - continue Abilify and Zoloft  Parkinsonian features - continue Sinemet     CPT CODE: 40981

## 2013-05-15 NOTE — Progress Notes (Signed)
Patient ID: Bonnie Riley, female   DOB: March 04, 1926, 79 y.o.   MRN: [id number]        PROGRESS NOTE  DATE: 05/15/2013   FACILITY: [facility]  LEVEL OF CARE: SNF (31)  Acute Visit  CHIEF COMPLAINT:  Discharge Notes  HISTORY OF PRESENT ILLNESS: This is an 79 year old female who is for discharge home to an Assisted Living Facility with Home health PT, OT and Nursing. She has beenad[facility] to [facility] on 03/20/13 from [facility]. She fell in a nursing facility and sustained a left intertrochanteric hip fracture and had ORIF/intramedullary implant. Patient was admitted to this facility for short-term rehabilitation after the patient's recent hospitalization.  Patient has completed SNF rehabilitation and therapy has cleared the patient for discharge.  Reassessment of ongoing problem(s):  DEPRESSION: The depression remains stable. Patient denies ongoing feelings of sadness, insomnia, anedhonia or lack of appetite. No complications reported from the medications currently being used. Staff do not report behavioral problems.  HYPOTHYROIDISM: The hypothyroidism remains stable. No complications noted from the medications presently being used.  The patient denies fatigue or constipation.  10/14  TSH 1.9860  PARKINSON'S DISEASE: pt's Parkinson's disease is stable.  Denies progression of sx recently.  Pt is tolerating Parkinson's disease medications without any complications.  PAST MEDICAL HISTORY : Reviewed.  No changes.  CURRENT MEDICATIONS: Reviewed per [facility]  REVIEW OF SYSTEMS:  GENERAL: no change in appetite, no fatigue, no weight changes, no fever, chills or weakness RESPIRATORY: no cough, SOB, DOE, wheezing, hemoptysis CARDIAC: no chest pain, edema or palpitations GI: no abdominal pain, diarrhea, constipation, heart burn, nausea or vomiting  PHYSICAL EXAMINATION  VS:  T98.3       P85       RR18      BP106/58      POX96 %       WT (Lb)  GENERAL: no acute  distress, normal body habitus NECK: supple, trachea midline, no neck masses, no thyroid tenderness, no thyromegaly LYMPHATICS: no LAN in the neck, no supraclavicular LAN RESPIRATORY: breathing is even & unlabored, BS CTAB CARDIAC: RRR, no murmur,no extra heart sounds, no edema GI: abdomen soft, normal BS, no masses, no tenderness, no hepatomegaly, no splenomegaly PSYCHIATRIC: the patient is alert & oriented to person, affect & behavior appropriate  LABS/RADIOLOGY: 04/11/13 sodium 141 potassium 3.0 glucose 82 BUN 14 creatinine 0.8 calcium 9.2 WBC 6.9 hemoglobin 12.4 hematocrit 40.1 03/23/13 sodium 142 potassium 3.4 glucose 103 BUN 13 creatinine 0.6 calcium 8.3 WBC 9.9 hemoglobin 9.3 hematocrit 29.8 03/21/13 WBC 8.7 hemoglobin 8.8 hematocrit 28.4 sodium 142 potassium 3.4 glucose 94 BUN 11 creatinine 0.7 calcium 8.3  tsh 1.9860 03/20/13 sodium 138 potassium 3.6 glucose 99 BUN 17 creatinine 0.73 calcium 8.0 WBC 8.7 hemoglobin 8.5 hematocrit 26.3  ASSESSMENT/PLAN:  Left intertrochanteric hip fracture S/P ORIF for Home health PT, OT and Nursing  Dementia with agitation - continue Aricept and Namenda  Hypothyroidism - continue Synthroid  Major Depression - continue Abilify and Depakote  Parkinsonian features - continue Sinemet  Hypokalemia - check BMP   I have filled out patient's discharge paperwork and written prescriptions.  Patient will receive home health PT, OT and Nursing.  Total discharge time: Less than 30 minutes  Discharge time involved coordination of the discharge process with Child psychotherapist, nursing staff and therapy department. Medical justification for home health services verified.  CPT CODE: 40981

## 2013-05-19 DIAGNOSIS — D649 Anemia, unspecified: Secondary | ICD-10-CM

## 2013-05-19 DIAGNOSIS — G20 Parkinson's disease: Secondary | ICD-10-CM

## 2013-05-19 DIAGNOSIS — S72009D Fracture of unspecified part of neck of unspecified femur, subsequent encounter for closed fracture with routine healing: Secondary | ICD-10-CM

## 2013-05-19 DIAGNOSIS — F0390 Unspecified dementia without behavioral disturbance: Secondary | ICD-10-CM

## 2013-06-21 ENCOUNTER — Inpatient Hospital Stay (HOSPITAL_COMMUNITY)
Admission: EM | Admit: 2013-06-21 | Discharge: 2013-06-23 | DRG: 309 | Disposition: A | Discharge status: Home Health | Payer: Medicare Other | Attending: Internal Medicine | Admitting: Internal Medicine

## 2013-06-21 ENCOUNTER — Encounter (HOSPITAL_COMMUNITY): Payer: Self-pay | Admitting: Emergency Medicine

## 2013-06-21 ENCOUNTER — Emergency Department (HOSPITAL_COMMUNITY): Payer: Medicare Other

## 2013-06-21 DIAGNOSIS — G20 Parkinson's disease: Secondary | ICD-10-CM

## 2013-06-21 DIAGNOSIS — M1990 Unspecified osteoarthritis, unspecified site: Secondary | ICD-10-CM | POA: Diagnosis present

## 2013-06-21 DIAGNOSIS — R29818 Other symptoms and signs involving the nervous system: Secondary | ICD-10-CM | POA: Diagnosis present

## 2013-06-21 DIAGNOSIS — E860 Dehydration: Secondary | ICD-10-CM | POA: Diagnosis present

## 2013-06-21 DIAGNOSIS — E039 Hypothyroidism, unspecified: Secondary | ICD-10-CM | POA: Diagnosis present

## 2013-06-21 DIAGNOSIS — G43909 Migraine, unspecified, not intractable, without status migrainosus: Secondary | ICD-10-CM | POA: Diagnosis present

## 2013-06-21 DIAGNOSIS — L89109 Pressure ulcer of unspecified part of back, unspecified stage: Secondary | ICD-10-CM | POA: Diagnosis present

## 2013-06-21 DIAGNOSIS — Z7982 Long term (current) use of aspirin: Secondary | ICD-10-CM

## 2013-06-21 DIAGNOSIS — W19XXXD Unspecified fall, subsequent encounter: Secondary | ICD-10-CM

## 2013-06-21 DIAGNOSIS — Z8261 Family history of arthritis: Secondary | ICD-10-CM

## 2013-06-21 DIAGNOSIS — Z87440 Personal history of urinary (tract) infections: Secondary | ICD-10-CM | POA: Diagnosis present

## 2013-06-21 DIAGNOSIS — Z833 Family history of diabetes mellitus: Secondary | ICD-10-CM

## 2013-06-21 DIAGNOSIS — S72001D Fracture of unspecified part of neck of right femur, subsequent encounter for closed fracture with routine healing: Secondary | ICD-10-CM

## 2013-06-21 DIAGNOSIS — E876 Hypokalemia: Secondary | ICD-10-CM

## 2013-06-21 DIAGNOSIS — F32A Depression, unspecified: Secondary | ICD-10-CM | POA: Diagnosis present

## 2013-06-21 DIAGNOSIS — D62 Acute posthemorrhagic anemia: Secondary | ICD-10-CM

## 2013-06-21 DIAGNOSIS — R259 Unspecified abnormal involuntary movements: Secondary | ICD-10-CM | POA: Diagnosis present

## 2013-06-21 DIAGNOSIS — Z66 Do not resuscitate: Secondary | ICD-10-CM | POA: Diagnosis present

## 2013-06-21 DIAGNOSIS — G20A1 Parkinson's disease without dyskinesia, without mention of fluctuations: Secondary | ICD-10-CM

## 2013-06-21 DIAGNOSIS — Z79899 Other long term (current) drug therapy: Secondary | ICD-10-CM

## 2013-06-21 DIAGNOSIS — L8991 Pressure ulcer of unspecified site, stage 1: Secondary | ICD-10-CM | POA: Diagnosis present

## 2013-06-21 DIAGNOSIS — M810 Age-related osteoporosis without current pathological fracture: Secondary | ICD-10-CM | POA: Diagnosis present

## 2013-06-21 DIAGNOSIS — Z8542 Personal history of malignant neoplasm of other parts of uterus: Secondary | ICD-10-CM

## 2013-06-21 DIAGNOSIS — E440 Moderate protein-calorie malnutrition: Secondary | ICD-10-CM | POA: Insufficient documentation

## 2013-06-21 DIAGNOSIS — I959 Hypotension, unspecified: Secondary | ICD-10-CM | POA: Diagnosis present

## 2013-06-21 DIAGNOSIS — S72002D Fracture of unspecified part of neck of left femur, subsequent encounter for closed fracture with routine healing: Secondary | ICD-10-CM

## 2013-06-21 DIAGNOSIS — F329 Major depressive disorder, single episode, unspecified: Secondary | ICD-10-CM | POA: Diagnosis present

## 2013-06-21 DIAGNOSIS — N390 Urinary tract infection, site not specified: Secondary | ICD-10-CM | POA: Diagnosis present

## 2013-06-21 DIAGNOSIS — I359 Nonrheumatic aortic valve disorder, unspecified: Secondary | ICD-10-CM | POA: Diagnosis present

## 2013-06-21 DIAGNOSIS — R159 Full incontinence of feces: Secondary | ICD-10-CM

## 2013-06-21 DIAGNOSIS — R32 Unspecified urinary incontinence: Secondary | ICD-10-CM

## 2013-06-21 DIAGNOSIS — K219 Gastro-esophageal reflux disease without esophagitis: Secondary | ICD-10-CM | POA: Diagnosis present

## 2013-06-21 DIAGNOSIS — F0390 Unspecified dementia without behavioral disturbance: Secondary | ICD-10-CM | POA: Diagnosis present

## 2013-06-21 DIAGNOSIS — I4891 Unspecified atrial fibrillation: Principal | ICD-10-CM | POA: Diagnosis present

## 2013-06-21 DIAGNOSIS — F3289 Other specified depressive episodes: Secondary | ICD-10-CM | POA: Diagnosis present

## 2013-06-21 HISTORY — DX: Malignant (primary) neoplasm, unspecified: C80.1

## 2013-06-21 HISTORY — DX: Unspecified osteoarthritis, unspecified site: M19.90

## 2013-06-21 HISTORY — DX: Fracture of right shoulder girdle, part unspecified, initial encounter for closed fracture: S42.91XA

## 2013-06-21 HISTORY — DX: Fracture of unspecified part of neck of unspecified femur, initial encounter for closed fracture: S72.009A

## 2013-06-21 HISTORY — DX: Rhabdomyolysis: M62.82

## 2013-06-21 HISTORY — DX: Anemia, unspecified: D64.9

## 2013-06-21 HISTORY — DX: Fracture of unspecified carpal bone, left wrist, initial encounter for closed fracture: S62.102A

## 2013-06-21 HISTORY — DX: Age-related osteoporosis without current pathological fracture: M81.0

## 2013-06-21 HISTORY — DX: Malignant neoplasm of uterus, part unspecified: C55

## 2013-06-21 LAB — CBC WITH DIFFERENTIAL/PLATELET
Eosinophils Absolute: 0 10*3/uL (ref 0.0–0.7)
Eosinophils Relative: 0 % (ref 0–5)
HCT: 43.8 % (ref 36.0–46.0)
Hemoglobin: 15 g/dL (ref 12.0–15.0)
Lymphocytes Relative: 15 % (ref 12–46)
Lymphs Abs: 1.3 10*3/uL (ref 0.7–4.0)
MCH: 31.8 pg (ref 26.0–34.0)
MCV: 92.8 fL (ref 78.0–100.0)
Monocytes Absolute: 1.3 10*3/uL — ABNORMAL HIGH (ref 0.1–1.0)
Monocytes Relative: 15 % — ABNORMAL HIGH (ref 3–12)
RBC: 4.72 MIL/uL (ref 3.87–5.11)

## 2013-06-21 LAB — BASIC METABOLIC PANEL
BUN: 18 mg/dL (ref 6–23)
CO2: 24 mEq/L (ref 19–32)
Calcium: 9.4 mg/dL (ref 8.4–10.5)
GFR calc non Af Amer: 51 mL/min — ABNORMAL LOW (ref >90)
Glucose, Bld: 103 mg/dL — ABNORMAL HIGH (ref 70–99)

## 2013-06-21 LAB — CBC
HCT: 39 % (ref 36.0–46.0)
Hemoglobin: 13.1 g/dL (ref 12.0–15.0)
MCV: 92.6 fL (ref 78.0–100.0)
RDW: 16.7 % — ABNORMAL HIGH (ref 11.5–15.5)
WBC: 7 10*3/uL (ref 4.0–10.5)

## 2013-06-21 LAB — URINALYSIS, ROUTINE W REFLEX MICROSCOPIC
Bilirubin Urine: NEGATIVE
Glucose, UA: NEGATIVE mg/dL
Hgb urine dipstick: NEGATIVE
Protein, ur: NEGATIVE mg/dL
Urobilinogen, UA: 0.2 mg/dL (ref 0.0–1.0)

## 2013-06-21 LAB — CREATININE, SERUM
GFR calc Af Amer: 87 mL/min — ABNORMAL LOW (ref >90)
GFR calc non Af Amer: 75 mL/min — ABNORMAL LOW (ref >90)

## 2013-06-21 LAB — PRO B NATRIURETIC PEPTIDE: Pro B Natriuretic peptide (BNP): 3862 pg/mL — ABNORMAL HIGH (ref 0–450)

## 2013-06-21 LAB — URINE MICROSCOPIC-ADD ON

## 2013-06-21 MED ORDER — ONDANSETRON HCL 4 MG PO TABS: 4.0000 mg | ORAL_TABLET | Freq: Four times a day (QID) | ORAL | Status: DC | PRN

## 2013-06-21 MED ORDER — SUCRALFATE 1 G PO TABS
1.0000 g | ORAL_TABLET | Freq: Two times a day (BID) | ORAL | Status: DC
  Administered 2013-06-22 – 2013-06-23 (×2): 1 g via ORAL
  Filled 2013-06-21 (×5): qty 1

## 2013-06-21 MED ORDER — ACETAMINOPHEN 500 MG PO TABS
1000.0000 mg | ORAL_TABLET | Freq: Two times a day (BID) | ORAL | Status: DC
  Administered 2013-06-21 – 2013-06-23 (×4): 1000 mg via ORAL
  Filled 2013-06-21 (×6): qty 2

## 2013-06-21 MED ORDER — LEVOTHYROXINE SODIUM 25 MCG PO TABS
212.5000 ug | ORAL_TABLET | Freq: Every day | ORAL | Status: DC
  Administered 2013-06-22 – 2013-06-23 (×2): 212.5 ug via ORAL
  Filled 2013-06-21 (×3): qty 0.5

## 2013-06-21 MED ORDER — BISACODYL 10 MG RE SUPP: 10.0000 mg | Freq: Every day | RECTAL | Status: DC | PRN

## 2013-06-21 MED ORDER — DEXTROSE 5 % IV SOLN
5.0000 mg/h | INTRAVENOUS | Status: DC
  Administered 2013-06-21: 5 mg/h via INTRAVENOUS

## 2013-06-21 MED ORDER — ASPIRIN EC 325 MG PO TBEC
325.0000 mg | DELAYED_RELEASE_TABLET | Freq: Every day | ORAL | Status: DC
  Administered 2013-06-22 – 2013-06-23 (×2): 325 mg via ORAL
  Filled 2013-06-21 (×2): qty 1

## 2013-06-21 MED ORDER — MEMANTINE HCL 10 MG PO TABS
10.0000 mg | ORAL_TABLET | Freq: Two times a day (BID) | ORAL | Status: DC
  Administered 2013-06-21 – 2013-06-23 (×4): 10 mg via ORAL
  Filled 2013-06-21 (×5): qty 1

## 2013-06-21 MED ORDER — SENNOSIDES-DOCUSATE SODIUM 8.6-50 MG PO TABS
2.0000 | ORAL_TABLET | Freq: Every day | ORAL | Status: DC
  Administered 2013-06-22 – 2013-06-23 (×2): 2 via ORAL
  Filled 2013-06-21 (×2): qty 2

## 2013-06-21 MED ORDER — ARIPIPRAZOLE 5 MG PO TABS
5.0000 mg | ORAL_TABLET | Freq: Every day | ORAL | Status: DC
  Administered 2013-06-22 – 2013-06-23 (×2): 5 mg via ORAL
  Filled 2013-06-21 (×2): qty 1

## 2013-06-21 MED ORDER — SODIUM CHLORIDE 0.9 % IV BOLUS (SEPSIS)
250.0000 mL | Freq: Once | INTRAVENOUS | Status: AC
  Administered 2013-06-21: 250 mL via INTRAVENOUS

## 2013-06-21 MED ORDER — ENOXAPARIN SODIUM 30 MG/0.3ML ~~LOC~~ SOLN
30.0000 mg | SUBCUTANEOUS | Status: DC
Start: 2013-06-21 — End: 2013-06-23
  Administered 2013-06-21 – 2013-06-22 (×2): 30 mg via SUBCUTANEOUS
  Filled 2013-06-21 (×3): qty 0.3

## 2013-06-21 MED ORDER — SODIUM CHLORIDE 0.9 % IV SOLN
INTRAVENOUS | Status: DC
  Administered 2013-06-21: 22:00:00 via INTRAVENOUS

## 2013-06-21 MED ORDER — CEFTRIAXONE SODIUM 1 G IJ SOLR
1.0000 g | INTRAMUSCULAR | Status: DC
  Administered 2013-06-21 – 2013-06-22 (×2): 1 g via INTRAVENOUS
  Filled 2013-06-21 (×3): qty 10

## 2013-06-21 MED ORDER — TRAMADOL HCL 50 MG PO TABS
50.0000 mg | ORAL_TABLET | Freq: Every day | ORAL | Status: DC
  Administered 2013-06-21 – 2013-06-22 (×2): 50 mg via ORAL
  Filled 2013-06-21 (×2): qty 1

## 2013-06-21 MED ORDER — FLEET ENEMA 7-19 GM/118ML RE ENEM
1.0000 | ENEMA | Freq: Once | RECTAL | Status: AC | PRN
  Filled 2013-06-21: qty 1

## 2013-06-21 MED ORDER — CARBIDOPA-LEVODOPA 25-100 MG PO TABS
1.0000 | ORAL_TABLET | Freq: Three times a day (TID) | ORAL | Status: DC
  Administered 2013-06-22 – 2013-06-23 (×4): 1 via ORAL
  Filled 2013-06-21 (×7): qty 1

## 2013-06-21 MED ORDER — DILTIAZEM HCL 100 MG IV SOLR
5.0000 mg/h | INTRAVENOUS | Status: DC
  Administered 2013-06-21: 5 mg/h via INTRAVENOUS
  Filled 2013-06-21 (×3): qty 100

## 2013-06-21 MED ORDER — POTASSIUM CHLORIDE 20 MEQ/15ML (10%) PO LIQD
20.0000 meq | Freq: Every day | ORAL | Status: DC
  Administered 2013-06-22 – 2013-06-23 (×2): 20 meq via ORAL
  Filled 2013-06-21 (×2): qty 15

## 2013-06-21 MED ORDER — ENSURE COMPLETE PO LIQD
237.0000 mL | Freq: Three times a day (TID) | ORAL | Status: DC
  Administered 2013-06-21 – 2013-06-22 (×3): 237 mL via ORAL

## 2013-06-21 MED ORDER — DIVALPROEX SODIUM 125 MG PO CPSP
250.0000 mg | ORAL_CAPSULE | Freq: Every morning | ORAL | Status: DC
  Administered 2013-06-22 – 2013-06-23 (×2): 250 mg via ORAL
  Filled 2013-06-21 (×2): qty 2

## 2013-06-21 MED ORDER — DIVALPROEX SODIUM 125 MG PO CPSP
500.0000 mg | ORAL_CAPSULE | Freq: Every day | ORAL | Status: DC
  Administered 2013-06-21 – 2013-06-22 (×2): 500 mg via ORAL
  Filled 2013-06-21 (×3): qty 4

## 2013-06-21 MED ORDER — LORAZEPAM 0.5 MG PO TABS: 0.5000 mg | ORAL_TABLET | Freq: Three times a day (TID) | ORAL | Status: DC | PRN

## 2013-06-21 MED ORDER — MIRTAZAPINE 30 MG PO TBDP
30.0000 mg | ORAL_TABLET | Freq: Every day | ORAL | Status: DC
  Administered 2013-06-21 – 2013-06-22 (×2): 30 mg via ORAL
  Filled 2013-06-21 (×3): qty 1

## 2013-06-21 MED ORDER — ONDANSETRON HCL 4 MG/2ML IJ SOLN
4.0000 mg | Freq: Four times a day (QID) | INTRAMUSCULAR | Status: DC | PRN
  Administered 2013-06-22: 4 mg via INTRAVENOUS
  Filled 2013-06-21: qty 2

## 2013-06-21 MED ORDER — SENNA-DOCUSATE SODIUM 8.6-50 MG PO TABS: 2.0000 | ORAL_TABLET | Freq: Every day | ORAL | Status: DC

## 2013-06-21 MED ORDER — LEVOTHYROXINE SODIUM 25 MCG PO TABS: 12.5000 ug | ORAL_TABLET | Freq: Every day | ORAL | Status: DC

## 2013-06-21 MED ORDER — DONEPEZIL HCL 23 MG PO TABS
23.0000 mg | ORAL_TABLET | Freq: Every day | ORAL | Status: DC
  Administered 2013-06-21 – 2013-06-22 (×2): 23 mg via ORAL
  Filled 2013-06-21 (×3): qty 1

## 2013-06-21 MED ORDER — OCUVITE-LUTEIN PO CAPS
1.0000 | ORAL_CAPSULE | Freq: Every day | ORAL | Status: DC
  Administered 2013-06-22 – 2013-06-23 (×2): 1 via ORAL
  Filled 2013-06-21 (×2): qty 1

## 2013-06-21 NOTE — ED Notes (Signed)
Pt rolled and skin checked since pt keeps reporting buttocks hurts. Sacrum is reddened but no open sore. Pt rolled and positioned to right side with pillow for support between knees.

## 2013-06-21 NOTE — ED Notes (Signed)
Pt from [facility] via [place]. Pt was in PT when the therapist noted hypotension and a fast HR.  On EMS arrival pt's HR was 160s-170s and in A-fib with no hx of same, BP 99-60, 88% RA.  Pt given 500 mL bolus NS, 10 mg cardizm.  Following BP was 128/94, HR 90s-110s occasionally jumping to 140s, and 96% on 3L.  Pt reports sleepiness and her bottom hurting.  Hx of dementia.  PT in NAD.

## 2013-06-21 NOTE — ED Notes (Signed)
MD at bedside. 

## 2013-06-21 NOTE — H&P (Signed)
[facility] History and Physical  Bonnie Riley ZOX:[id number] DOB: 1925-07-26 DOA: 06/21/2013  Referring physician: EDP PCP: MAZZOCCHI, Rise Mu, MD   Chief Complaint: low blood pressure and tachycardia during PT  HPI: Bonnie Riley is a 79 y.o. female with dementia who was sent to ED with hypotension and tachycardia.  No records available from ALF.  All hx per chart and daughter.  Pt unable to provide any history, but denies chest pain or dyspnea.  C/o "bottom hurts".  Pt was working with PT, reportedly c/o not feeling well.  BP was checked and reportedly "low".  Pt received 500cc NS en route. On arrival in ER, HR in the 150s and 160s, atrial fib. SBP in the 90s. No h/o same. Has received around 800cc NS in ED, and started on cardizem gtt.  HR now around 110.  Also has UA consistent with UTI.  Per daughter, pt has not been eating much, and had declined tremendously since hip fracture a few months ago. Had been on appetite stimulant at SNF, but this has been stopped.  Largely bed and chair bound.  Code status is DNR.  Review of Systems: unable due to dementia.  Past Medical History  Diagnosis Date  . Altered mental status   . Depression   . Hypothyroidism   . Gastritis   . Arthritis   . Migraines   . History of recurrent UTIs   . Alteration in bowel elimination: incontinence   . Urinary bladder incontinence   . Hematoma     Hx of right shin hematoma  . Osteoarthrosis   . Anemia   . Rhabdomyolysis   . Osteoporosis   . Shoulder fracture, right   . Hip fracture     bilateral  . Wrist fracture, left   . Dementia   . Cancer   . Uterine cancer    Past Surgical History  Procedure Laterality Date  . Tonsillectomy    . Femur im nail  06/01/2011    Procedure: INTRAMEDULLARY (IM) NAIL FEMORAL;  Surgeon: Eldred Manges;  Location: [facility];  Service: Orthopedics;  Laterality: Right;  . Intramedullary (im) nail intertrochanteric Left 03/18/2013    Procedure: INTRAMEDULLARY (IM)  NAIL INTERTROCHANTRIC;  Surgeon: [name], MD;  Location: [facility];  Service: Orthopedics;  Laterality: Left;  . Fracture surgery    . Abdominal hysterectomy    . Eye surgery     Social History:  reports that she has never smoked. She has never used smokeless tobacco. She reports that she does not drink alcohol or use illicit drugs.  No Known Allergies  Family History  Problem Relation Age of Onset  . Arthritis Father   . Migraines Sister   . Diabetes Brother      Prior to Admission medications   Medication Sig Start Date End Date Taking? Authorizing Provider  acetaminophen (TYLENOL) 500 MG tablet Take 1,000 mg by mouth 2 (two) times daily.   Yes Historical Provider, MD  ARIPiprazole (ABILIFY) 5 MG tablet Take 5 mg by mouth daily.   Yes Historical Provider, MD  aspirin 81 MG chewable tablet Chew 81 mg by mouth daily.   Yes Historical Provider, MD  Calcium-Vitamin D (CALTRATE 600 PLUS-VIT D PO) Take 600 mg by mouth daily.     Yes Historical Provider, MD  carbidopa-levodopa (SINEMET IR) 25-100 MG per tablet Take 1 tablet by mouth 3 (three) times daily. 11/21/12  Yes Levert Feinstein, MD  divalproex (DEPAKOTE SPRINKLE) 125 MG capsule Take 250 mg  by mouth every morning.   Yes Historical Provider, MD  divalproex (DEPAKOTE SPRINKLE) 125 MG capsule Take 500 mg by mouth at bedtime.   Yes Historical Provider, MD  donepezil (ARICEPT) 23 MG TABS tablet Take 23 mg by mouth at bedtime.   Yes Historical Provider, MD  feeding supplement (ENSURE COMPLETE) LIQD Take 237 mLs by mouth 2 (two) times daily between meals. 03/20/13  Yes Elease Etienne, MD  levothyroxine (SYNTHROID, LEVOTHROID) 200 MCG tablet Take 212.5 mcg by mouth daily before breakfast.   Yes Historical Provider, MD  levothyroxine (SYNTHROID, LEVOTHROID) 25 MCG tablet Take 12.5 mcg by mouth daily before breakfast.   Yes Historical Provider, MD  LORazepam (ATIVAN) 0.5 MG tablet Take 1 tablet (0.5 mg total) by mouth every 8 (eight) hours as  needed for anxiety (agitation). For anxiety or agitation 03/20/13  Yes Elease Etienne, MD  memantine (NAMENDA) 10 MG tablet Take 10 mg by mouth 2 (two) times daily.   Yes Historical Provider, MD  mirtazapine (REMERON SOL-TAB) 30 MG disintegrating tablet Take 30 mg by mouth at bedtime.   Yes Historical Provider, MD  multivitamin-lutein (OCUVITE-LUTEIN) CAPS Take 1 capsule by mouth daily.   Yes Historical Provider, MD  nystatin cream (MYCOSTATIN) Apply 1 application topically 2 (two) times daily. Apply to vaginal area until healed.   Yes Historical Provider, MD  potassium chloride 20 MEQ/15ML (10%) solution Take 20 mEq by mouth daily.   Yes Historical Provider, MD  sennosides-docusate sodium (SENOKOT-S) 8.6-50 MG tablet Take 2 tablets by mouth daily as needed. For constipation   Yes Historical Provider, MD  sucralfate (CARAFATE) 1 G tablet Take 1 g by mouth 2 (two) times daily.     Yes Historical Provider, MD  traMADol (ULTRAM) 50 MG tablet Take 50 mg by mouth at bedtime.   Yes Historical Provider, MD   Physical Exam: Filed Vitals:   06/21/13 1700  BP: 99/60  Pulse: 105  Resp: 20    BP 99/60  Pulse 105  Resp 20  SpO2 95%  BP 106/64  Pulse 63  Temp(Src) 97.4 F (36.3 C) (Oral)  Resp 13  SpO2 97%  General Appearance:    Alert, cooperative, no distress, oriented only to person  Head:    Normocephalic, without obvious abnormality, atraumatic  Eyes:    PERRL, conjunctiva/corneas clear, EOM's intact, fundi    benign, both eyes          Nose:   Nares normal, septum midline, mucosa normal, no drainage   or sinus tenderness  Throat:   Dry mucous membranes. No thrush, ulcers.  Neck:   Supple, symmetrical, trachea midline, no adenopathy;       thyroid:  No enlargement/tenderness/nodules; no carotid   bruit or JVD  Back:     Symmetric, no curvature, ROM normal, no CVA tenderness  Lungs:     Clear to auscultation bilaterally, respirations unlabored  Chest wall:    No tenderness or  deformity  Heart:    irreg irreg without MGR  Abdomen:     Soft, non-tender, bowel sounds active all four quadrants,    no masses, no organomegaly  Genitalia:    deferred  Rectal:    deferred  Extremities:   Extremities normal, atraumatic, no cyanosis or edema  Pulses:   2+ and symmetric all extremities  Skin:   Dry with stage 1 sacral decubitus  Lymph nodes:   Cervical, supraclavicular, and axillary nodes normal  Neurologic:   CNII-XII intact. Motor strength  nonfocal             Psych: normal affect  Labs on Admission:  Basic Metabolic Panel:  Recent Labs Lab 06/21/13 1454  NA 139  K 4.6  CL 101  CO2 24  GLUCOSE 103*  BUN 18  CREATININE 0.98  CALCIUM 9.4   Liver Function Tests: No results found for this basename: AST, ALT, ALKPHOS, BILITOT, PROT, ALBUMIN,  in the last 168 hours No results found for this basename: LIPASE, AMYLASE,  in the last 168 hours No results found for this basename: AMMONIA,  in the last 168 hours CBC:  Recent Labs Lab 06/21/13 1454  WBC 9.0  NEUTROABS 6.3  HGB 15.0  HCT 43.8  MCV 92.8  PLT 269   Cardiac Enzymes:  Recent Labs Lab 06/21/13 1454  TROPONINI <0.30    BNP (last 3 results)  Recent Labs  06/21/13 1454  PROBNP 3862.0*   CBG: No results found for this basename: GLUCAP,  in the last 168 hours  Radiological Exams on Admission: Dg Chest Port 1 View  06/21/2013   CLINICAL DATA:  Weakness.  EXAM: PORTABLE CHEST - 1 VIEW  COMPARISON:  03/17/2013, 06/01/2011.  FINDINGS: There is a small right pleural effusion. There is right lung reticular nodular disease primarily in the mid lung similar to the prior examinations 06/01/2011. There is no new focal parenchymal opacity. Is no pneumothorax. The heart size is enlarged.  The osseous structures are unremarkable.  IMPRESSION: Interval development of a small right pleural effusion of nonspecific etiology.   Electronically Signed   By: Elige Ko   On: 06/21/2013 15:29    EKG:  atrial fib with RVR   Assessment/Plan Principal Problem:   Atrial fibrillation with RVR, new: check TSH. Echo. May be precipitated by UTI and dehydration.  Not a good coumadin candidate due to fall risk.  Increase ASA to 325 mg. Continue cardizem gtt.  Blood pressure better Active Problems:   UTI (urinary tract infection): rocephin. cx pending Elevated pro BNP. Clinically no CHF, rather volume depleted.   Dementia   Parkinsonian features   Hypothyroidism: see above. On a large dose synthroid. Need to r/o iatrogenic hyperthyroid state   History of recurrent UTIs   GERD (gastroesophageal reflux disease)   Dehydration: has received almost 1.5 L saline. kvo for now, due to elevated pBNP. Echo ordered. No h/o CHF   Depression  Code Status: DNR Family Communication: daughter at bedside Disposition Plan: back to ALF  Time spent: 60 min  Teira Arcilla L [facility] Pager [phone]

## 2013-06-21 NOTE — Progress Notes (Signed)
Pt relabeled to NSR approximately 2230.  MD on call notified.  Will continue to monitor closely.  Clovis Fredrickson A

## 2013-06-21 NOTE — ED Notes (Signed)
Urine sample obtained; brief changed; repositioned to left side.

## 2013-06-21 NOTE — ED Provider Notes (Signed)
CSN: [id number]     Arrival date & time 06/21/13  1411 History   First MD Initiated Contact with Patient 06/21/13 1439     Chief Complaint  Patient presents with  . Atrial Fibrillation   (Consider location/radiation/quality/duration/timing/severity/associated sxs/prior Treatment) HPI Comments: Bonnie Riley is a 79 y.o. female who presents for evaluation of weakness, tachycardia and hypotension. The patient was doing rehabilitation at her assisted living facility, today, when the therapist noticed that she was uncomfortable, and took her vital signs. He was transferred by EMS for evaluation. During transfer she received any IV bolus of saline with improvement of her blood pressure and heart rate. The patient is asymptomatic on arrival to the emergency department. She states that she has no problems when lying supine on a stretcher. Her daughter is with her and is getting additional information. Her daughter is concerned that she is not eating well for the last several months. She seemed to worsen when she fractured her hip in September 2014. She was in a rehabilitation facility, then discharged to an assisted living facility about 2 months ago. She is essentially wheelchair 100% of the time, secondary to weakness and persistent, left hip pain, which followed the surgery. The patient is a very poor historian. History is from nursing home documentation as well as the daughter. There are no other known modifying factors.   Patient is a 79 y.o. female presenting with atrial fibrillation. The history is provided by the patient.  Atrial Fibrillation    Past Medical History  Diagnosis Date  . Altered mental status   . Depression   . Hypothyroidism   . Gastritis   . Arthritis   . Migraines   . History of recurrent UTIs   . Alteration in bowel elimination: incontinence   . Urinary bladder incontinence   . Hematoma     Hx of right shin hematoma  . Osteoarthrosis   . Anemia   . Rhabdomyolysis    . Osteoporosis   . Shoulder fracture, right   . Hip fracture     bilateral  . Wrist fracture, left   . Dementia   . Cancer   . Uterine cancer    Past Surgical History  Procedure Laterality Date  . Tonsillectomy    . Femur im nail  06/01/2011    Procedure: INTRAMEDULLARY (IM) NAIL FEMORAL;  Surgeon: Eldred Manges;  Location: [facility];  Service: Orthopedics;  Laterality: Right;  . Intramedullary (im) nail intertrochanteric Left 03/18/2013    Procedure: INTRAMEDULLARY (IM) NAIL INTERTROCHANTRIC;  Surgeon: [name], MD;  Location: [facility];  Service: Orthopedics;  Laterality: Left;  . Fracture surgery    . Abdominal hysterectomy    . Eye surgery     Family History  Problem Relation Age of Onset  . Arthritis Father   . Migraines Sister   . Diabetes Brother    History  Substance Use Topics  . Smoking status: Never Smoker   . Smokeless tobacco: Never Used  . Alcohol Use: No   OB History   Grav Para Term Preterm Abortions TAB SAB Ect Mult Living                 Review of Systems  All other systems reviewed and are negative.    Allergies  Review of patient's allergies indicates no known allergies.  Home Medications   Current Outpatient Rx  Name  Route  Sig  Dispense  Refill  . acetaminophen (TYLENOL) 500 MG tablet  Oral   Take 1,000 mg by mouth 2 (two) times daily.         . ARIPiprazole (ABILIFY) 5 MG tablet   Oral   Take 5 mg by mouth daily.         Marland Kitchen aspirin 81 MG chewable tablet   Oral   Chew 81 mg by mouth daily.         . Calcium-Vitamin D (CALTRATE 600 PLUS-VIT D PO)   Oral   Take 600 mg by mouth daily.           . carbidopa-levodopa (SINEMET IR) 25-100 MG per tablet   Oral   Take 1 tablet by mouth 3 (three) times daily.   90 tablet   12   . divalproex (DEPAKOTE SPRINKLE) 125 MG capsule   Oral   Take 250 mg by mouth every morning.         . divalproex (DEPAKOTE SPRINKLE) 125 MG capsule   Oral   Take 500 mg by mouth at  bedtime.         . donepezil (ARICEPT) 23 MG TABS tablet   Oral   Take 23 mg by mouth at bedtime.         . feeding supplement (ENSURE COMPLETE) LIQD   Oral   Take 237 mLs by mouth 2 (two) times daily between meals.         Marland Kitchen levothyroxine (SYNTHROID, LEVOTHROID) 200 MCG tablet   Oral   Take 212.5 mcg by mouth daily before breakfast.         . levothyroxine (SYNTHROID, LEVOTHROID) 25 MCG tablet   Oral   Take 12.5 mcg by mouth daily before breakfast.         . LORazepam (ATIVAN) 0.5 MG tablet   Oral   Take 1 tablet (0.5 mg total) by mouth every 8 (eight) hours as needed for anxiety (agitation). For anxiety or agitation   10 tablet   0   . memantine (NAMENDA) 10 MG tablet   Oral   Take 10 mg by mouth 2 (two) times daily.         . mirtazapine (REMERON SOL-TAB) 30 MG disintegrating tablet   Oral   Take 30 mg by mouth at bedtime.         . multivitamin-lutein (OCUVITE-LUTEIN) CAPS   Oral   Take 1 capsule by mouth daily.         Marland Kitchen nystatin cream (MYCOSTATIN)   Topical   Apply 1 application topically 2 (two) times daily. Apply to vaginal area until healed.         . potassium chloride 20 MEQ/15ML (10%) solution   Oral   Take 20 mEq by mouth daily.         . sennosides-docusate sodium (SENOKOT-S) 8.6-50 MG tablet   Oral   Take 2 tablets by mouth daily as needed. For constipation         . sucralfate (CARAFATE) 1 G tablet   Oral   Take 1 g by mouth 2 (two) times daily.           . traMADol (ULTRAM) 50 MG tablet   Oral   Take 50 mg by mouth at bedtime.          BP 99/60  Pulse 105  Resp 20  SpO2 95% Physical Exam  Nursing note and vitals reviewed. Constitutional: She is oriented to person, place, and time. She appears well-developed.  Elderly, frail  HENT:  Head:  Normocephalic and atraumatic.  Eyes: Conjunctivae and EOM are normal. Pupils are equal, round, and reactive to light.  Neck: Normal range of motion and phonation normal.  Neck supple.  Cardiovascular: Intact distal pulses.   Irregular tachycardia  Pulmonary/Chest: Effort normal and breath sounds normal. She exhibits no tenderness.  Abdominal: Soft. She exhibits no distension and no mass. There is no tenderness. There is no guarding.  Musculoskeletal: Normal range of motion. She exhibits tenderness (Left hip, mild to palpation and passive range of motion.).  Neurological: She is alert and oriented to person, place, and time. She exhibits normal muscle tone.  Skin: Skin is warm and dry.  Psychiatric: She has a normal mood and affect. Her behavior is normal. Judgment and thought content normal.    ED Course  Procedures (including critical care time) Medications  diltiazem (CARDIZEM) 100 mg in dextrose 5 % 100 mL infusion (5 mg/hr Intravenous New Bag/Given 06/21/13 1532)  sodium chloride 0.9 % bolus 250 mL (250 mLs Intravenous New Bag/Given 06/21/13 1544)    Patient Vitals for the past 24 hrs:  BP Pulse Resp SpO2  06/21/13 1700 99/60 mmHg 105 20 95 %  06/21/13 1650 104/89 mmHg 73 26 97 %  06/21/13 1630 96/80 mmHg 100 18 98 %  06/21/13 1530 95/75 mmHg 78 20 97 %  06/21/13 1515 105/83 mmHg 131 21 97 %  06/21/13 1501 114/83 mmHg - 20 -  06/21/13 1500 - 119 21 97 %  06/21/13 1445 102/83 mmHg 38 34 88 %  06/21/13 1437 96/78 mmHg 86 30 84 %  06/21/13 1430 91/63 mmHg 123 25 96 %  06/21/13 1418 - - 16 -  06/21/13 1417 - - - 96 %  06/21/13 1415 112/86 mmHg - - -   5:40 PM Reevaluation with update and discussion. After initial assessment and treatment, an updated evaluation reveals she continues to be tachycardic and have low blood pressure. She continues to be alert.Mancel Bale L   5:50 PM-Consult complete with Dr. Lendell Caprice. Patient case explained and discussed. She agrees to admit patient for further evaluation and treatment. Call ended at 1755   CRITICAL CARE Performed by: Flint Melter Total critical care time: 45 minutes Critical care time was  exclusive of separately billable procedures and treating other patients. Critical care was necessary to treat or prevent imminent or life-threatening deterioration. Critical care was time spent personally by me on the following activities: development of treatment plan with patient and/or surrogate as well as nursing, discussions with consultants, evaluation of patient's response to treatment, examination of patient, obtaining history from patient or surrogate, ordering and performing treatments and interventions, ordering and review of laboratory studies, ordering and review of radiographic studies, pulse oximetry and re-evaluation of patient's condition.  Labs Review Labs Reviewed  CBC WITH DIFFERENTIAL - Abnormal; Notable for the following:    RDW 16.5 (*)    Monocytes Relative 15 (*)    Monocytes Absolute 1.3 (*)    All other components within normal limits  BASIC METABOLIC PANEL - Abnormal; Notable for the following:    Glucose, Bld 103 (*)    GFR calc non Af Amer 51 (*)    GFR calc Af Amer 59 (*)    All other components within normal limits  URINALYSIS, ROUTINE W REFLEX MICROSCOPIC - Abnormal; Notable for the following:    APPearance HAZY (*)    Ketones, ur 15 (*)    Nitrite POSITIVE (*)    Leukocytes, UA MODERATE (*)  All other components within normal limits  PRO B NATRIURETIC PEPTIDE - Abnormal; Notable for the following:    Pro B Natriuretic peptide (BNP) 3862.0 (*)    All other components within normal limits  URINE MICROSCOPIC-ADD ON - Abnormal; Notable for the following:    Squamous Epithelial / LPF FEW (*)    Bacteria, UA MANY (*)    All other components within normal limits  URINE CULTURE  TROPONIN I   Imaging Review Dg Chest Port 1 View  06/21/2013   CLINICAL DATA:  Weakness.  EXAM: PORTABLE CHEST - 1 VIEW  COMPARISON:  03/17/2013, 06/01/2011.  FINDINGS: There is a small right pleural effusion. There is right lung reticular nodular disease primarily in the mid  lung similar to the prior examinations 06/01/2011. There is no new focal parenchymal opacity. Is no pneumothorax. The heart size is enlarged.  The osseous structures are unremarkable.  IMPRESSION: Interval development of a small right pleural effusion of nonspecific etiology.   Electronically Signed   By: Elige Ko   On: 06/21/2013 15:29    EKG Interpretation    Date/Time:  Thursday June 21 2013 14:19:54 EST Ventricular Rate:  132 PR Interval:    QRS Duration: 102 QT Interval:  333 QTC Calculation: 493 R Axis:   47 Text Interpretation:  Atrial fibrillation Probable left ventricular hypertrophy Borderline T abnormalities, diffuse leads Borderline prolonged QT interval Since last tracing Atrial fibrillation , new Confirmed by Effie Shy  MD, Donathan Buller ([id number]) on 06/21/2013 4:06:15 PM            MDM   1. Atrial fibrillation with RVR   2. Dehydration     New Onset atrial fibrillation with dehydration, as likely source. Recent hip fracture is likely compounded poor intake and debilitated state. She is being admitted for further treatment and stabilization prior to consideration for discharge. She is DO NOT RESUSCITATE, but the power of attorney, requested that she receive treatment that can improve her status without aggressive intervention.  Nursing Notes Reviewed/ Care Coordinated, and agree without changes. Applicable Imaging Reviewed.  Interpretation of Laboratory Data incorporated into ED treatment  Plan: Admit   Flint Melter, MD 06/21/13 1811

## 2013-06-22 DIAGNOSIS — E440 Moderate protein-calorie malnutrition: Secondary | ICD-10-CM | POA: Insufficient documentation

## 2013-06-22 DIAGNOSIS — I359 Nonrheumatic aortic valve disorder, unspecified: Secondary | ICD-10-CM

## 2013-06-22 LAB — TSH: TSH: 2.258 u[IU]/mL (ref 0.350–4.500)

## 2013-06-22 MED ORDER — ENSURE PUDDING PO PUDG: 1.0000 | Freq: Three times a day (TID) | ORAL | Status: DC

## 2013-06-22 MED ORDER — DILTIAZEM HCL ER COATED BEADS 120 MG PO CP24
120.0000 mg | ORAL_CAPSULE | Freq: Every day | ORAL | Status: DC
  Administered 2013-06-22 – 2013-06-23 (×2): 120 mg via ORAL
  Filled 2013-06-22 (×3): qty 1

## 2013-06-22 NOTE — Progress Notes (Signed)
Utilization Review Completed.Bonnie Riley T12/06/2013  

## 2013-06-22 NOTE — Care Management Note (Signed)
    Page 1 of 2   06/23/2013     1:16:03 PM   CARE MANAGEMENT NOTE 06/23/2013  Patient:  Bonnie Riley, Bonnie Riley   Account Number:  [id number]  Date Initiated:  06/22/2013  Documentation initiated by:  AMERSON,JULIE  Subjective/Objective Assessment:   PT ADM ON 12/11 WITH AFIB WITH RVR, UTI.  PTA, PT RESIDES AT [facility].  SHE IS ACTIVE WITH [facility] FOR HHRN, HHPT, AND HHOT.     Action/Plan:   CSW CONSULTED TO FACILITATE RETURN TO ALF WHEN MEDICALLY STABLE.  PT WILL NEED RESUMPTION OF CARE ORDERS FOR HOME HEALTH CARE PRIOR TO DC.   Anticipated DC Date:  06/25/2013   Anticipated DC Plan:  ASSISTED LIVING / REST HOME  In-house referral  Clinical Social Worker      DC Planning Services  CM consult      [facility] Choice  Resumption Of Svcs/PTA Provider   Choice offered to / List presented to:          [facility] arranged  HH-1 RN  HH-2 PT  HH-3 OT      [facility] agency  [facility]   Status of service:  Completed, signed off Medicare Important Message given?   (If response is "NO", the following Medicare IM given date fields will be blank) Date Medicare IM given:   Date Additional Medicare IM given:    Discharge Disposition:  HOME W HOME HEALTH SERVICES  Per UR Regulation:  Reviewed for med. necessity/level of care/duration of stay  If discussed at Long Length of Stay Meetings, dates discussed:    Comments:  06/23/13 13:14 Letha Cape RN, BSN [phone] patient will resume with [facility], NCM notified Lupita Leash with [facility] that patient is for dc today.

## 2013-06-22 NOTE — Progress Notes (Signed)
[facility] PROGRESS NOTE  Bonnie Riley WUJ:[id number] DOB: 21-May-1926 DOA: 06/21/2013 PCP: MAZZOCCHI, Rise Mu, MD  Assessment/Plan: 1. Atrial fibrillation with RVR: currently NSR. TSH normal. ECHO with aortic regurg. Continue ASA 325mg . Stop cardizem gtt and start oral  2. UTI: culture pending. Continue rocephin for now. 3. Dementia: at baseline 4. Hypothyroidism: controled, TSH normal 5. Dehydration: improved 6. Malnutrition: nutrition consult, daughter reports patient not eating 7. Depression: controled, seems very content today  Code Status: DNR Family Communication: spoke with daughter and patient Disposition Plan: transition to oral meds and return to ALF   Consultants:  none  Procedures:  2D ECHO: EF 45-50%, moderate to severe aortic regurg, moderately dialated aorta  Antibiotics:  Rocephin 12/11>>  HPI/Subjective: Bonnie Riley is a 79 y.o. female with dementia who was sent to ED with hypotension and tachycardia. No records available from ALF. All hx per chart and daughter. Pt unable to provide any history, but denies chest pain or dyspnea. C/o "bottom hurts". Pt was working with PT, reportedly c/o not feeling well. BP was checked and reportedly "low". Pt received 500cc NS en route. On arrival in ER, HR in the 150s and 160s, atrial fib. SBP in the 90s. No h/o same. Has received around 800cc NS in ED, and started on cardizem gtt. HR now around 110. Also has UA consistent with UTI. Per daughter, pt has not been eating much, and had declined tremendously since hip fracture a few months ago. Had been on appetite stimulant at SNF, but this has been stopped. Largely bed and chair bound. Code status is DNR.  Today she is comfortable with no complaints.  Her daughter reports that she is back to mental status baseline.   Objective: Filed Vitals:   06/22/13 1452  BP: 138/66  Pulse: 68  Temp: 97.4 F (36.3 C)  Resp: 18    Intake/Output Summary (Last 24 hours)  at 06/22/13 1508 Last data filed at 06/22/13 1300  Gross per 24 hour  Intake    480 ml  Output      1 ml  Net    479 ml   Filed Weights   06/21/13 2033  Weight: 59.8 kg (131 lb 13.4 oz)    Exam:   General:  NAD  Cardiovascular: rrr no mrg  Respiratory: CTAB  Abdomen: BS+, soft, NT  Musculoskeletal: no edema   Data Reviewed: Basic Metabolic Panel:  Recent Labs Lab 06/21/13 1454 06/21/13 2235  NA 139  --   K 4.6  --   CL 101  --   CO2 24  --   GLUCOSE 103*  --   BUN 18  --   CREATININE 0.98 0.75  CALCIUM 9.4  --    Liver Function Tests: No results found for this basename: AST, ALT, ALKPHOS, BILITOT, PROT, ALBUMIN,  in the last 168 hours No results found for this basename: LIPASE, AMYLASE,  in the last 168 hours No results found for this basename: AMMONIA,  in the last 168 hours CBC:  Recent Labs Lab 06/21/13 1454 06/21/13 2235  WBC 9.0 7.0  NEUTROABS 6.3  --   HGB 15.0 13.1  HCT 43.8 39.0  MCV 92.8 92.6  PLT 269 255   Cardiac Enzymes:  Recent Labs Lab 06/21/13 1454  TROPONINI <0.30   BNP (last 3 results)  Recent Labs  06/21/13 1454  PROBNP 3862.0*   CBG: No results found for this basename: GLUCAP,  in the last 168 hours  No results found for this  or any previous visit (from the past 240 hour(s)).   Studies: Dg Chest Port 1 View  06/21/2013   CLINICAL DATA:  Weakness.  EXAM: PORTABLE CHEST - 1 VIEW  COMPARISON:  03/17/2013, 06/01/2011.  FINDINGS: There is a small right pleural effusion. There is right lung reticular nodular disease primarily in the mid lung similar to the prior examinations 06/01/2011. There is no new focal parenchymal opacity. Is no pneumothorax. The heart size is enlarged.  The osseous structures are unremarkable.  IMPRESSION: Interval development of a small right pleural effusion of nonspecific etiology.   Electronically Signed   By: Elige Ko   On: 06/21/2013 15:29    Scheduled Meds: . acetaminophen  1,000 mg  Oral BID  . ARIPiprazole  5 mg Oral Daily  . aspirin EC  325 mg Oral Daily  . carbidopa-levodopa  1 tablet Oral TID WC  . cefTRIAXone (ROCEPHIN)  IV  1 g Intravenous Q24H  . divalproex  250 mg Oral q morning - 10a  . divalproex  500 mg Oral QHS  . donepezil  23 mg Oral QHS  . enoxaparin (LOVENOX) injection  30 mg Subcutaneous Q24H  . feeding supplement (ENSURE)  1 Container Oral TID BM  . levothyroxine  212.5 mcg Oral QAC breakfast  . memantine  10 mg Oral BID  . mirtazapine  30 mg Oral QHS  . multivitamin-lutein  1 capsule Oral Daily  . potassium chloride  20 mEq Oral Daily  . senna-docusate  2 tablet Oral Daily  . sucralfate  1 g Oral BID AC  . traMADol  50 mg Oral QHS   Continuous Infusions: . sodium chloride 20 mL/hr at 06/21/13 2214  . diltiazem (CARDIZEM) infusion 5 mg/hr (06/21/13 2214)    Principal Problem:   Atrial fibrillation with RVR Active Problems:   Dementia   Parkinsonian features   Hypothyroidism   History of recurrent UTIs   GERD (gastroesophageal reflux disease)   UTI (urinary tract infection)   Dehydration   Depression   Malnutrition of moderate degree    Time spent: 35 minutes    [facility]  [facility] Pager [phone]. If 7PM-7AM, please contact night-coverage at [email or web address], password [facility] 06/22/2013, 3:08 PM  LOS: 1 day

## 2013-06-22 NOTE — Progress Notes (Signed)
INITIAL NUTRITION ASSESSMENT  DOCUMENTATION CODES Per approved criteria  -Non-severe (moderate) malnutrition in the context of chronic illness   INTERVENTION:  Ensure Pudding 3 times daily (170 kcals, 4 gm protein per 4 oz cup) RD to follow for nutrition care plan  NUTRITION DIAGNOSIS: Increased nutrient needs related to failure to thrive, malnutrition as evidenced by estimated nutrition needs  Goal: Pt to meet >/= 90% of their estimated nutrition needs   Monitor:  PO & supplemental intake, weight, labs, I/O's  Reason for Assessment: Consult  79 y.o. female  Admitting Dx: Atrial fibrillation with RVR  ASSESSMENT: Patient with dementia who was sent to ED with hypotension and tachycardia; also had UA consistent with UTI; per daughter, patient has had a decreased appetite with tremendous decline since hip fracture few months ago; mostly chair/bed bound.  Patient reports her appetite has been "ok;" per H&P patient's appetite has been suboptima PTA (lives at [facility]); PO intake at 50% per flowsheet records; patient with visible muscle loss to upper body; amenable to Ensure Pudding supplement (ie Vanilla flavor); noted she had previously been on an appetite stimulant.  Patient meets criteria for non-severe (moderate) malnutrition in the context of chronic illness as evidenced by < 75% intake of estimated energy requirement for > 1 month, 7% weight loss x 3 months and mild muscle loss (deltoids & pectoralis muscle).  Height: Ht Readings from Last 1 Encounters:  06/21/13 5\' 2"  (1.575 m)    Weight: Wt Readings from Last 1 Encounters:  06/21/13 131 lb 13.4 oz (59.8 kg)    Ideal Body Weight: 110 lb  % Ideal Body Weight: 119%  Wt Readings from Last 10 Encounters:  06/21/13 131 lb 13.4 oz (59.8 kg)  03/17/13 141 lb (63.957 kg)  03/17/13 141 lb (63.957 kg)  11/21/12 134 lb (60.782 kg)  06/01/11 155 lb (70.308 kg)  06/01/11 154 lb 5.2 oz (70 kg)  06/01/11 155 lb  (70.308 kg)    Usual Body Weight: 141 lb  % Usual Body Weight:   BMI:  Body mass index is 24.11 kg/(m^2).  Estimated Nutritional Needs: Kcal: 1300-1500 Protein: 60-70 gm Fluid: >/= 1.5 L  Skin: Intact  Diet Order: General  EDUCATION NEEDS: -No education needs identified at this time   Intake/Output Summary (Last 24 hours) at 06/22/13 1232 Last data filed at 06/22/13 0800  Gross per 24 hour  Intake    240 ml  Output      1 ml  Net    239 ml    Labs:   Recent Labs Lab 06/21/13 1454 06/21/13 2235  NA 139  --   K 4.6  --   CL 101  --   CO2 24  --   BUN 18  --   CREATININE 0.98 0.75  CALCIUM 9.4  --   GLUCOSE 103*  --     Scheduled Meds: . acetaminophen  1,000 mg Oral BID  . ARIPiprazole  5 mg Oral Daily  . aspirin EC  325 mg Oral Daily  . carbidopa-levodopa  1 tablet Oral TID WC  . cefTRIAXone (ROCEPHIN)  IV  1 g Intravenous Q24H  . divalproex  250 mg Oral q morning - 10a  . divalproex  500 mg Oral QHS  . donepezil  23 mg Oral QHS  . enoxaparin (LOVENOX) injection  30 mg Subcutaneous Q24H  . feeding supplement (ENSURE COMPLETE)  237 mL Oral TID BM  . levothyroxine  212.5 mcg Oral QAC breakfast  .  memantine  10 mg Oral BID  . mirtazapine  30 mg Oral QHS  . multivitamin-lutein  1 capsule Oral Daily  . potassium chloride  20 mEq Oral Daily  . senna-docusate  2 tablet Oral Daily  . sucralfate  1 g Oral BID AC  . traMADol  50 mg Oral QHS    Continuous Infusions: . sodium chloride 20 mL/hr at 06/21/13 2214  . diltiazem (CARDIZEM) infusion 5 mg/hr (06/21/13 2214)    Past Medical History  Diagnosis Date  . Altered mental status   . Depression   . Hypothyroidism   . Gastritis   . Arthritis   . Migraines   . History of recurrent UTIs   . Alteration in bowel elimination: incontinence   . Urinary bladder incontinence   . Hematoma     Hx of right shin hematoma  . Osteoarthrosis   . Anemia   . Rhabdomyolysis   . Osteoporosis   . Shoulder  fracture, right   . Hip fracture     bilateral  . Wrist fracture, left   . Dementia   . Cancer   . Uterine cancer     Past Surgical History  Procedure Laterality Date  . Tonsillectomy    . Femur im nail  06/01/2011    Procedure: INTRAMEDULLARY (IM) NAIL FEMORAL;  Surgeon: Eldred Manges;  Location: [facility];  Service: Orthopedics;  Laterality: Right;  . Intramedullary (im) nail intertrochanteric Left 03/18/2013    Procedure: INTRAMEDULLARY (IM) NAIL INTERTROCHANTRIC;  Surgeon: [name], MD;  Location: [facility];  Service: Orthopedics;  Laterality: Left;  . Fracture surgery    . Abdominal hysterectomy    . Eye surgery      Maureen Chatters, RD, LDN Pager #: [phone] After-Hours Pager #: [phone]

## 2013-06-22 NOTE — Progress Notes (Signed)
OT Cancellation Note  Patient Details Name: Bonnie Riley MRN: [id number] DOB: 09/21/1925   Cancelled Treatment:    Reason Eval/Treat Not Completed: OT screened, no needs identified, will sign off - Spoke with PT.  Pt requires +2 for all mobility and was total A for all self care activities at ALF.  No OT needs identified  Boykin Reaper [phone] 06/22/2013, 1:28 PM

## 2013-06-22 NOTE — Evaluation (Signed)
Physical Therapy Evaluation Patient Details Name: Bonnie Riley MRN: [id number] DOB: [birth year] Today's Date: 06/22/2013 Time: 4098-1191 PT Time Calculation (min): 17 min  PT Assessment / Plan / Recommendation History of Present Illness  Pt adm from ALF with A-fib with RVR.  Pt with hip fx with ORIF in Sept.  Clinical Impression  Pt admitted with above. Pt currently with functional limitations due to the deficits listed below (see PT Problem List).  Pt will benefit from skilled PT to increase their independence and safety with mobility to allow discharge back to ALF. ALF has been providing adequate assist for pt and she is receiving PT there.     PT Assessment  Patient needs continued PT services    Follow Up Recommendations  Home health PT;Supervision/Assistance - 24 hour (at ALF)    Does the patient have the potential to tolerate intense rehabilitation      Barriers to Discharge        Equipment Recommendations  None recommended by PT    Recommendations for Other Services     Frequency Min 3X/week    Precautions / Restrictions Precautions Precautions: Fall   Pertinent Vitals/Pain VSS      Mobility  Bed Mobility Bed Mobility: Supine to Sit;Sitting - Scoot to Edge of Bed Supine to Sit: 1: +2 Total assist Supine to Sit: Patient Percentage: 50% Sitting - Scoot to Edge of Bed: 2: Max assist Details for Bed Mobility Assistance: Assist to bring legs over and trunk up. Transfers Transfers: Sit to Stand;Stand to Sit;Stand Pivot Transfers Sit to Stand: 1: +2 Total assist;With upper extremity assist;From bed Sit to Stand: Patient Percentage: 40% Stand to Sit: 1: +2 Total assist;To chair/3-in-1;Without upper extremity assist Stand to Sit: Patient Percentage: 20% Stand Pivot Transfers: 1: +2 Total assist Stand Pivot Transfers: Patient Percentage: 30% Details for Transfer Assistance: Pt began sitting prior to pivoting all the way to the chair.  Hips and knees began  flexing.      Exercises     PT Diagnosis: Difficulty walking;Generalized weakness  PT Problem List: Decreased strength;Decreased activity tolerance;Decreased balance;Decreased mobility;Decreased knowledge of use of DME;Decreased knowledge of precautions;Decreased safety awareness PT Treatment Interventions: DME instruction;Functional mobility training;Therapeutic activities;Therapeutic exercise;Balance training;Patient/family education     PT Goals(Current goals can be found in the care plan section) Acute Rehab PT Goals Patient Stated Goal: Go home PT Goal Formulation: With patient Time For Goal Achievement: 06/29/13 Potential to Achieve Goals: Fair  Visit Information  Last PT Received On: 06/22/13 Assistance Needed: +2 History of Present Illness: Pt adm from ALF with A-fib with RVR.  Pt with hip fx with ORIF in Sept.       Prior Functioning  Home Living Family/patient expects to be discharged to:: Assisted living Home Equipment: Dan Humphreys - 2 wheels;Wheelchair - manual Additional Comments: [employer] Prior Function Level of Independence: Needs assistance Gait / Transfers Assistance Needed: Assist to transfer and amb 18 steps with therapy. ADL's / Homemaking Assistance Needed: Dependent Comments: Pt reports she mostly spends time in w/c. Communication Communication: No difficulties    Cognition  Cognition Arousal/Alertness: Awake/alert Behavior During Therapy: WFL for tasks assessed/performed Overall Cognitive Status: History of cognitive impairments - at baseline    Extremity/Trunk Assessment Upper Extremity Assessment Upper Extremity Assessment: Generalized weakness Lower Extremity Assessment Lower Extremity Assessment: Generalized weakness   Balance Balance Balance Assessed: Yes Static Sitting Balance Static Sitting - Balance Support: Bilateral upper extremity supported;Feet supported Static Sitting - Level of Assistance: 5: Stand by assistance  Static  Standing Balance Static Standing - Balance Support: Right upper extremity supported;Left upper extremity supported Static Standing - Level of Assistance: 1: +2 Total assist  End of Session PT - End of Session Equipment Utilized During Treatment: Gait belt Activity Tolerance: Patient limited by fatigue Patient left: in chair;with call bell/phone within reach Nurse Communication: Mobility status;Need for lift equipment  GP     Mozes Sagar 06/22/2013, 3:14 PM  [facility] PT [phone]

## 2013-06-22 NOTE — Progress Notes (Signed)
OT Cancellation Note  Patient Details Name: Bonnie Riley MRN: [id number] DOB: Aug 03, 1925   Cancelled Treatment:    Reason Eval/Treat Not Completed: Patient at procedure or test/ unavailable - will reattempt later today  Boykin Reaper [phone] 06/22/2013, 10:19 AM

## 2013-06-22 NOTE — Progress Notes (Signed)
  Echocardiogram 2D Echocardiogram has been performed.  Bonnie Riley 06/22/2013, 10:59 AM

## 2013-06-23 LAB — CBC
HCT: 37.9 % (ref 36.0–46.0)
MCV: 94 fL (ref 78.0–100.0)
Platelets: 261 10*3/uL (ref 150–400)
RDW: 16.8 % — ABNORMAL HIGH (ref 11.5–15.5)
WBC: 6.1 10*3/uL (ref 4.0–10.5)

## 2013-06-23 LAB — BASIC METABOLIC PANEL
BUN: 17 mg/dL (ref 6–23)
CO2: 23 mEq/L (ref 19–32)
Chloride: 105 mEq/L (ref 96–112)
GFR calc Af Amer: >90 mL/min (ref >90)
GFR calc non Af Amer: 80 mL/min — ABNORMAL LOW (ref >90)
Potassium: 3.8 mEq/L (ref 3.5–5.1)
Sodium: 139 mEq/L (ref 135–145)

## 2013-06-23 LAB — URINE CULTURE: Colony Count: >=100000

## 2013-06-23 MED ORDER — ASPIRIN 325 MG PO TBEC: 325.0000 mg | DELAYED_RELEASE_TABLET | Freq: Every day | ORAL | Status: AC

## 2013-06-23 MED ORDER — DILTIAZEM HCL ER COATED BEADS 120 MG PO CP24: 120.0000 mg | ORAL_CAPSULE | Freq: Every day | ORAL | Status: AC

## 2013-06-23 MED ORDER — CIPROFLOXACIN HCL 250 MG PO TABS
250.0000 mg | ORAL_TABLET | Freq: Two times a day (BID) | ORAL | Status: DC
  Administered 2013-06-23: 250 mg via ORAL
  Filled 2013-06-23 (×3): qty 1

## 2013-06-23 MED ORDER — CIPROFLOXACIN HCL 250 MG PO TABS: 250.0000 mg | ORAL_TABLET | Freq: Two times a day (BID) | ORAL | Status: DC

## 2013-06-23 MED ORDER — ENOXAPARIN SODIUM 40 MG/0.4ML ~~LOC~~ SOLN
40.0000 mg | SUBCUTANEOUS | Status: DC
  Filled 2013-06-23: qty 0.4

## 2013-06-23 NOTE — Discharge Summary (Signed)
Physician Discharge Summary  Jamilee Lafosse NWG:[id number] DOB: 1925/09/29 DOA: 06/21/2013  PCP: [name], MD  Admit date: 06/21/2013 Discharge date: 06/23/2013  Time spent: 45 minutes  Recommendations for Outpatient Follow-up:  1. Will need continued physical and occupational therapy   Discharge Diagnoses:  Principal Problem:   Atrial fibrillation with RVR Active Problems:   Dementia   Parkinsonian features   Hypothyroidism   History of recurrent UTIs   GERD (gastroesophageal reflux disease)   UTI (urinary tract infection)   Dehydration   Depression   Malnutrition of moderate degree   Discharge Condition: stable  Diet recommendation: regular diet  Filed Weights   06/21/13 2033  Weight: 59.8 kg (131 lb 13.4 oz)    History of present illness:  Bonnie Riley is a 79 y.o. female with dementia who was sent to ED with hypotension and tachycardia. No records available from ALF. All hx per chart and daughter. Pt unable to provide any history, but denies chest pain or dyspnea. C/o "bottom hurts". Pt was working with PT, reportedly c/o not feeling well. BP was checked and reportedly "low". Pt received 500cc NS en route. On arrival in ER, HR in the 150s and 160s, atrial fib. SBP in the 90s. No h/o same. Has received around 800cc NS in ED, and started on cardizem gtt. HR now around 110. Also has UA consistent with UTI. Per daughter, pt has not been eating much, and had declined tremendously since hip fracture a few months ago. Had been on appetite stimulant at SNF, but this has been stopped. Largely bed and chair bound. Code status is DNR.   Hospital Course:  1. Atrial fibrillation with RVR: Presented in atrial fibrillation with HR in 160's.  Placed on Cardizem drip in ED and converted to normal sinus rhythm within a few hours. Transitioned to oral cardizem and has remained rate controled in normal sinus rhythm for 24 hours.  Will continue on low dose oral cardizem.   Will continue on high dose aspirin as she is not a good candidate for full anticoagulation due to fall risk.  TSH normal. ECHO shows decreased EF and aortic regurgitation.  2. UTI: treated in patient with rocephin. Culture shows klebsiella pneumoniae broadly sensitive. She is discharged on ciprofloxacin 250mg  bid for a total of 7 days of antibiotic treatment. 3. Malnutrition: patient is underweight and eating very little. Could consider an appetite stimulant in the future. 4. Hypothyroidism: TSH normal.  No changes were made to her replacement dose. 5. Dementia: stable in hospital. No changes made to medical management.  6. Deconditioning and weakness: Stable. She worked with PT in hospital and will need continued PT services. 7. History of left hip fracture in 03/2013 8. Decreased systolic function: this seems to be a new finding.  She did have an elevated BNP on presentation, did not show any signs of failure during admission.  She may need further evaluation as an outpatient. 9. Aortic regurgitation: seems to be a new finding as well.  Procedures:  2D ECHO 06/22/13: EF 45-50%, focal LV basal hypertrophy, moderate to severe aortic regurgitation  Consultations:  none  Discharge Exam: Filed Vitals:   06/23/13 0431  BP: 119/63  Pulse: 77  Temp: 97.6 F (36.4 C)  Resp: 17    General: alert, comfortable, no distress Cardiovascular: rrr no mrg Respiratory: CTAB good air movement  Discharge Instructions  Discharge Orders   Future Orders Complete By Expires   Call MD for:  difficulty breathing, headache or visual  disturbances  As directed    Call MD for:  persistant dizziness or light-headedness  As directed    Call MD for:  persistant nausea and vomiting  As directed    Call MD for:  redness, tenderness, or signs of infection (pain, swelling, redness, odor or green/yellow discharge around incision site)  As directed    Call MD for:  temperature >100.4  As directed    Diet - low  sodium heart healthy  As directed    Discharge instructions  As directed    Comments:     Note that this patient is on 3 new medications: ASA 325mg  daily cardizem 120 mg daily Ciprofloxacin 250 mg bid for 3 days   Increase activity slowly  As directed        Medication List    STOP taking these medications       aspirin 81 MG chewable tablet  Replaced by:  aspirin 325 MG EC tablet      TAKE these medications       acetaminophen 500 MG tablet  Commonly known as:  TYLENOL  Take 1,000 mg by mouth 2 (two) times daily.     ARIPiprazole 5 MG tablet  Commonly known as:  ABILIFY  Take 5 mg by mouth daily.     aspirin 325 MG EC tablet  Take 1 tablet (325 mg total) by mouth daily.     CALTRATE 600 PLUS-VIT D PO  Take 600 mg by mouth daily.     carbidopa-levodopa 25-100 MG per tablet  Commonly known as:  SINEMET IR  Take 1 tablet by mouth 3 (three) times daily.     ciprofloxacin 250 MG tablet  Commonly known as:  CIPRO  Take 1 tablet (250 mg total) by mouth 2 (two) times daily.     diltiazem 120 MG 24 hr capsule  Commonly known as:  CARDIZEM CD  Take 1 capsule (120 mg total) by mouth daily.     divalproex 125 MG capsule  Commonly known as:  DEPAKOTE SPRINKLE  Take 250 mg by mouth every morning.     divalproex 125 MG capsule  Commonly known as:  DEPAKOTE SPRINKLE  Take 500 mg by mouth at bedtime.     donepezil 23 MG Tabs tablet  Commonly known as:  ARICEPT  Take 23 mg by mouth at bedtime.     feeding supplement (ENSURE COMPLETE) Liqd  Take 237 mLs by mouth 2 (two) times daily between meals.     levothyroxine 200 MCG tablet  Commonly known as:  SYNTHROID, LEVOTHROID  Take 212.5 mcg by mouth daily before breakfast.     levothyroxine 25 MCG tablet  Commonly known as:  SYNTHROID, LEVOTHROID  Take 12.5 mcg by mouth daily before breakfast.     LORazepam 0.5 MG tablet  Commonly known as:  ATIVAN  Take 1 tablet (0.5 mg total) by mouth every 8 (eight) hours as  needed for anxiety (agitation). For anxiety or agitation     memantine 10 MG tablet  Commonly known as:  NAMENDA  Take 10 mg by mouth 2 (two) times daily.     mirtazapine 30 MG disintegrating tablet  Commonly known as:  REMERON SOL-TAB  Take 30 mg by mouth at bedtime.     multivitamin-lutein Caps capsule  Take 1 capsule by mouth daily.     nystatin cream  Commonly known as:  MYCOSTATIN  Apply 1 application topically 2 (two) times daily. Apply to vaginal area until healed.  potassium chloride 20 MEQ/15ML (10%) solution  Take 20 mEq by mouth daily.     sennosides-docusate sodium 8.6-50 MG tablet  Commonly known as:  SENOKOT-S  Take 2 tablets by mouth daily as needed. For constipation     sucralfate 1 G tablet  Commonly known as:  CARAFATE  Take 1 g by mouth 2 (two) times daily.     traMADol 50 MG tablet  Commonly known as:  ULTRAM  Take 50 mg by mouth at bedtime.       No Known Allergies     Follow-up Information   Follow up with MAZZOCCHI, Rise Mu, MD In 1 week.   Specialty:  Family Medicine   Contact information:   [address]ure[address] [place] [address] [phone]        The results of significant diagnostics from this hospitalization (including imaging, microbiology, ancillary and laboratory) are listed below for reference.    Significant Diagnostic Studies: Dg Chest Port 1 View  06/21/2013   CLINICAL DATA:  Weakness.  EXAM: PORTABLE CHEST - 1 VIEW  COMPARISON:  03/17/2013, 06/01/2011.  FINDINGS: There is a small right pleural effusion. There is right lung reticular nodular disease primarily in the mid lung similar to the prior examinations 06/01/2011. There is no new focal parenchymal opacity. Is no pneumothorax. The heart size is enlarged.  The osseous structures are unremarkable.  IMPRESSION: Interval development of a small right pleural effusion of nonspecific etiology.   Electronically Signed   By: Elige Ko   On: 06/21/2013 15:29     Microbiology: Recent Results (from the past 240 hour(s))  URINE CULTURE     Status: None   Collection Time    06/21/13  5:40 PM      Result Value Range Status   Specimen Description URINE, CATHETERIZED   Final   Special Requests NONE   Final   Culture  Setup Time     Final   Value: 06/21/2013 22:01     Performed at [facility] Count     Final   Value: >=100,000 COLONIES/ML     Performed at [facility]   Culture     Final   Value: KLEBSIELLA PNEUMONIAE     Performed at [facility]   Report Status 06/23/2013 FINAL   Final   Organism ID, Bacteria KLEBSIELLA PNEUMONIAE   Final     Labs: Basic Metabolic Panel:  Recent Labs Lab 06/21/13 1454 06/21/13 2235 06/23/13 0355  NA 139  --  139  K 4.6  --  3.8  CL 101  --  105  CO2 24  --  23  GLUCOSE 103*  --  79  BUN 18  --  17  CREATININE 0.98 0.75 0.63  CALCIUM 9.4  --  8.6   Liver Function Tests: No results found for this basename: AST, ALT, ALKPHOS, BILITOT, PROT, ALBUMIN,  in the last 168 hours No results found for this basename: LIPASE, AMYLASE,  in the last 168 hours No results found for this basename: AMMONIA,  in the last 168 hours CBC:  Recent Labs Lab 06/21/13 1454 06/21/13 2235 06/23/13 0355  WBC 9.0 7.0 6.1  NEUTROABS 6.3  --   --   HGB 15.0 13.1 12.6  HCT 43.8 39.0 37.9  MCV 92.8 92.6 94.0  PLT 269 255 261   Cardiac Enzymes:  Recent Labs Lab 06/21/13 1454  TROPONINI <0.30   BNP: BNP (last 3 results)  Recent Labs  06/21/13 1454  PROBNP 3862.0*   CBG: No results found for this basename: GLUCAP,  in the last 168 hours     Signed:  Hawthorne Day  [facility] 06/23/2013, 9:13 AM

## 2013-06-23 NOTE — Progress Notes (Signed)
Pt returning to ALF with home health providing physical therapy.  SW coordinating transport.  Nurse to Nurse report given.  Daughter in agreement with plan.

## 2013-06-23 NOTE — Progress Notes (Signed)
Patient is medically stable for D/C to [facility] (ALF). Patient will continue to have home health services at ALF through [facility]. RN case manager Stanton Kidney reported that it has been set up. Victorino Dike, RN at ALF reported that she would call Eugenie Birks to confirm that home health has been set up. Clinical Child psychotherapist (CSW) prepared D/C packet and arranged non-emergency EMS transport. Daughter is at bedside and aware of above. Nursing is aware of above. Please reconsult if further social work needs arise. CSW signing off.   Jetta Lout, LCSWA Weekend CSW [phone]

## 2013-07-15 ENCOUNTER — Emergency Department (HOSPITAL_COMMUNITY)
Admission: EM | Admit: 2013-07-15 | Discharge: 2013-07-15 | Disposition: A | Discharge status: Home / Self Care | Payer: Medicare Other | Attending: Emergency Medicine | Admitting: Emergency Medicine

## 2013-07-15 ENCOUNTER — Encounter (HOSPITAL_COMMUNITY): Payer: Self-pay | Admitting: Emergency Medicine

## 2013-07-15 ENCOUNTER — Emergency Department (HOSPITAL_COMMUNITY): Payer: Medicare Other

## 2013-07-15 DIAGNOSIS — W06XXXA Fall from bed, initial encounter: Secondary | ICD-10-CM | POA: Insufficient documentation

## 2013-07-15 DIAGNOSIS — F3289 Other specified depressive episodes: Secondary | ICD-10-CM | POA: Insufficient documentation

## 2013-07-15 DIAGNOSIS — Z79899 Other long term (current) drug therapy: Secondary | ICD-10-CM | POA: Insufficient documentation

## 2013-07-15 DIAGNOSIS — Z8719 Personal history of other diseases of the digestive system: Secondary | ICD-10-CM | POA: Insufficient documentation

## 2013-07-15 DIAGNOSIS — Z8542 Personal history of malignant neoplasm of other parts of uterus: Secondary | ICD-10-CM | POA: Insufficient documentation

## 2013-07-15 DIAGNOSIS — Z8781 Personal history of (healed) traumatic fracture: Secondary | ICD-10-CM | POA: Insufficient documentation

## 2013-07-15 DIAGNOSIS — Z87440 Personal history of urinary (tract) infections: Secondary | ICD-10-CM | POA: Insufficient documentation

## 2013-07-15 DIAGNOSIS — S7001XA Contusion of right hip, initial encounter: Secondary | ICD-10-CM

## 2013-07-15 DIAGNOSIS — G43909 Migraine, unspecified, not intractable, without status migrainosus: Secondary | ICD-10-CM | POA: Insufficient documentation

## 2013-07-15 DIAGNOSIS — Z792 Long term (current) use of antibiotics: Secondary | ICD-10-CM | POA: Insufficient documentation

## 2013-07-15 DIAGNOSIS — F0390 Unspecified dementia without behavioral disturbance: Secondary | ICD-10-CM | POA: Insufficient documentation

## 2013-07-15 DIAGNOSIS — Z7982 Long term (current) use of aspirin: Secondary | ICD-10-CM | POA: Insufficient documentation

## 2013-07-15 DIAGNOSIS — F329 Major depressive disorder, single episode, unspecified: Secondary | ICD-10-CM | POA: Insufficient documentation

## 2013-07-15 DIAGNOSIS — Y9210 Unspecified residential institution as the place of occurrence of the external cause: Secondary | ICD-10-CM | POA: Insufficient documentation

## 2013-07-15 DIAGNOSIS — E039 Hypothyroidism, unspecified: Secondary | ICD-10-CM | POA: Insufficient documentation

## 2013-07-15 DIAGNOSIS — Y9389 Activity, other specified: Secondary | ICD-10-CM | POA: Insufficient documentation

## 2013-07-15 DIAGNOSIS — Z862 Personal history of diseases of the blood and blood-forming organs and certain disorders involving the immune mechanism: Secondary | ICD-10-CM | POA: Insufficient documentation

## 2013-07-15 DIAGNOSIS — M1990 Unspecified osteoarthritis, unspecified site: Secondary | ICD-10-CM | POA: Insufficient documentation

## 2013-07-15 DIAGNOSIS — S7000XA Contusion of unspecified hip, initial encounter: Secondary | ICD-10-CM | POA: Insufficient documentation

## 2013-07-15 DIAGNOSIS — M810 Age-related osteoporosis without current pathological fracture: Secondary | ICD-10-CM | POA: Insufficient documentation

## 2013-07-15 NOTE — Discharge Instructions (Signed)

## 2013-07-15 NOTE — ED Provider Notes (Signed)
CSN: [id number]     Arrival date & time 07/15/13  1801 History   First MD Initiated Contact with Patient 07/15/13 1809     Chief Complaint  Patient presents with  . Fall    HPI  Patient transferred from [facility] extended care facility. Has history of dementia. These issues in her bed and reaching for her to be remote. [place] out of bed. Complains of right hip pain. She normally walks physical therapy, a gait belt, and walker. Has history of previous hip fractures to both her left and right hip. No other areas of pain or concern or injury. No loss of consciousness per staff report.  Past Medical History  Diagnosis Date  . Altered mental status   . Depression   . Hypothyroidism   . Gastritis   . Arthritis   . Migraines   . History of recurrent UTIs   . Alteration in bowel elimination: incontinence   . Urinary bladder incontinence   . Hematoma     Hx of right shin hematoma  . Osteoarthrosis   . Anemia   . Rhabdomyolysis   . Osteoporosis   . Shoulder fracture, right   . Hip fracture     bilateral  . Wrist fracture, left   . Dementia   . Cancer   . Uterine cancer    Past Surgical History  Procedure Laterality Date  . Tonsillectomy    . Femur im nail  06/01/2011    Procedure: INTRAMEDULLARY (IM) NAIL FEMORAL;  Surgeon: Marybelle Killings;  Location: [facility]S;  Service: Orthopedics;  Laterality: Right;  . Intramedullary (im) nail intertrochanteric Left 03/18/2013    Procedure: INTRAMEDULLARY (IM) NAIL INTERTROCHANTRIC;  Surgeon: Marianna Payment, MD;  Location: [facility] ORS;  Service: Orthopedics;  Laterality: Left;  . Fracture surgery    . Abdominal hysterectomy    . Eye surgery     Family History  Problem Relation Age of Onset  . Arthritis Father   . Migraines Sister   . Diabetes Brother    History  Substance Use Topics  . Smoking status: Never Smoker   . Smokeless tobacco: Never Used  . Alcohol Use: No   OB History   Grav Para Term Preterm Abortions TAB SAB Ect Mult  Living                 Review of Systems  Unable to perform ROS: Dementia    Allergies  Review of patient's allergies indicates no known allergies.  Home Medications   Current Outpatient Rx  Name  Route  Sig  Dispense  Refill  . ARIPiprazole (ABILIFY) 5 MG tablet   Oral   Take 5 mg by mouth daily.         Marland Kitchen aspirin EC 325 MG EC tablet   Oral   Take 1 tablet (325 mg total) by mouth daily.   30 tablet   0   . Calcium-Vitamin D (CALTRATE 600 PLUS-VIT D PO)   Oral   Take 600 mg by mouth daily.           Marland Kitchen diltiazem (CARDIZEM CD) 120 MG 24 hr capsule   Oral   Take 1 capsule (120 mg total) by mouth daily.   60 capsule   6   . divalproex (DEPAKOTE SPRINKLE) 125 MG capsule   Oral   Take 250 mg by mouth every morning.         Marland Kitchen levothyroxine (SYNTHROID, LEVOTHROID) 200 MCG tablet  Oral   Take 212.5 mcg by mouth daily before breakfast.         . levothyroxine (SYNTHROID, LEVOTHROID) 25 MCG tablet   Oral   Take 12.5 mcg by mouth daily before breakfast.         . acetaminophen (TYLENOL) 500 MG tablet   Oral   Take 1,000 mg by mouth 2 (two) times daily.         . carbidopa-levodopa (SINEMET IR) 25-100 MG per tablet   Oral   Take 1 tablet by mouth 3 (three) times daily.   90 tablet   12   . ciprofloxacin (CIPRO) 250 MG tablet   Oral   Take 1 tablet (250 mg total) by mouth 2 (two) times daily.   7 tablet   0   . divalproex (DEPAKOTE SPRINKLE) 125 MG capsule   Oral   Take 500 mg by mouth at bedtime.         . donepezil (ARICEPT) 23 MG TABS tablet   Oral   Take 23 mg by mouth at bedtime.         . feeding supplement (ENSURE COMPLETE) LIQD   Oral   Take 237 mLs by mouth 2 (two) times daily between meals.         Marland Kitchen LORazepam (ATIVAN) 0.5 MG tablet   Oral   Take 1 tablet (0.5 mg total) by mouth every 8 (eight) hours as needed for anxiety (agitation). For anxiety or agitation   10 tablet   0   . memantine (NAMENDA) 10 MG tablet   Oral    Take 10 mg by mouth 2 (two) times daily.         . mirtazapine (REMERON SOL-TAB) 30 MG disintegrating tablet   Oral   Take 30 mg by mouth at bedtime.         . multivitamin-lutein (OCUVITE-LUTEIN) CAPS   Oral   Take 1 capsule by mouth daily.         Marland Kitchen nystatin cream (MYCOSTATIN)   Topical   Apply 1 application topically 2 (two) times daily. Apply to vaginal area until healed.         . potassium chloride 20 MEQ/15ML (10%) solution   Oral   Take 20 mEq by mouth daily.         . sennosides-docusate sodium (SENOKOT-S) 8.6-50 MG tablet   Oral   Take 2 tablets by mouth daily as needed. For constipation         . sucralfate (CARAFATE) 1 G tablet   Oral   Take 1 g by mouth 2 (two) times daily.           . traMADol (ULTRAM) 50 MG tablet   Oral   Take 50 mg by mouth at bedtime.          BP 146/71  Pulse 65  Temp(Src) 97.7 F (36.5 C) (Oral)  Resp 20  SpO2 95% Physical Exam  Constitutional: She appears well-developed and well-nourished. No distress.  HENT:  Head: Normocephalic.  She is awake alert. No signs of trauma or strength to the head.  Eyes: Conjunctivae are normal. Pupils are equal, round, and reactive to light. No scleral icterus.  Neck: Normal range of motion. Neck supple. No thyromegaly present.  Cardiovascular: Normal rate and regular rhythm.  Exam reveals no gallop and no friction rub.   No murmur heard. Pulmonary/Chest: Effort normal and breath sounds normal. No respiratory distress. She has no wheezes. She has no  rales.  Abdominal: Soft. Bowel sounds are normal. She exhibits no distension. There is no tenderness. There is no rebound.  Musculoskeletal: Normal range of motion.  Range of motion of the bilateral hips and pelvis. Nontender to pubic rami medially. Nontender in the crest laterally. Nontender in the trochanters bilaterally  Neurological: She is alert.  Skin: Skin is warm and dry. No rash noted.  Psychiatric: She has a normal mood and  affect. Cognition and memory are impaired.    ED Course  Procedures (including critical care time) Labs Review Labs Reviewed - No data to display Imaging Review Dg Hip Complete Right  07/15/2013   CLINICAL DATA:  Fall with right hip pain.  EXAM: RIGHT HIP - COMPLETE 2+ VIEW  COMPARISON:  DG HIP COMPLETE*R* dated 03/12/2013  FINDINGS: Bilateral proximal femoral fixation. Moderate to marked osteopenia. Femoral heads are located. Remote proximal right femoral fracture. No hardware complication or acute fracture identified.  IMPRESSION: Postsurgical changes and osteopenia. No acute osseous abnormality.   Electronically Signed   By: Abigail Miyamoto M.D.   On: 07/15/2013 19:30    EKG Interpretation   None       MDM   1. Contusion, hip, right, initial encounter    X rays show no fracture, stable hardware.  Will transfer back to facility    Tanna Furry, MD 07/15/13 2029

## 2013-07-15 NOTE — ED Notes (Signed)
Patient transported to X-ray 

## 2013-07-15 NOTE — ED Notes (Signed)
Bed: YD74 Expected date: 07/15/13 Expected time: 6:04 PM Means of arrival:  Comments: fall

## 2013-07-15 NOTE — ED Notes (Signed)
Per EMS; pt from [facility]. Pt had unwitnessed fall, last seen at 1400 staff found pt b/w night stand and bed at 1622. Pt normally gets around with wheelchair. Pt c/o left hip pain upon arrival shortening or rotation noted. No blood thinners. In route she was c/o right hip pain.

## 2013-10-10 DEATH — deceased

## 2014-08-07 IMAGING — CR DG HIP COMPLETE 2+V*R*
3 series · 3 of 3 positions shown · non-contrast
Comparison: DG HIP COMPLETE*R* dated 03/12/2013

CLINICAL DATA: Fall with right hip pain.

EXAM:
RIGHT HIP - COMPLETE 2+ VIEW

[t pelvis ap]
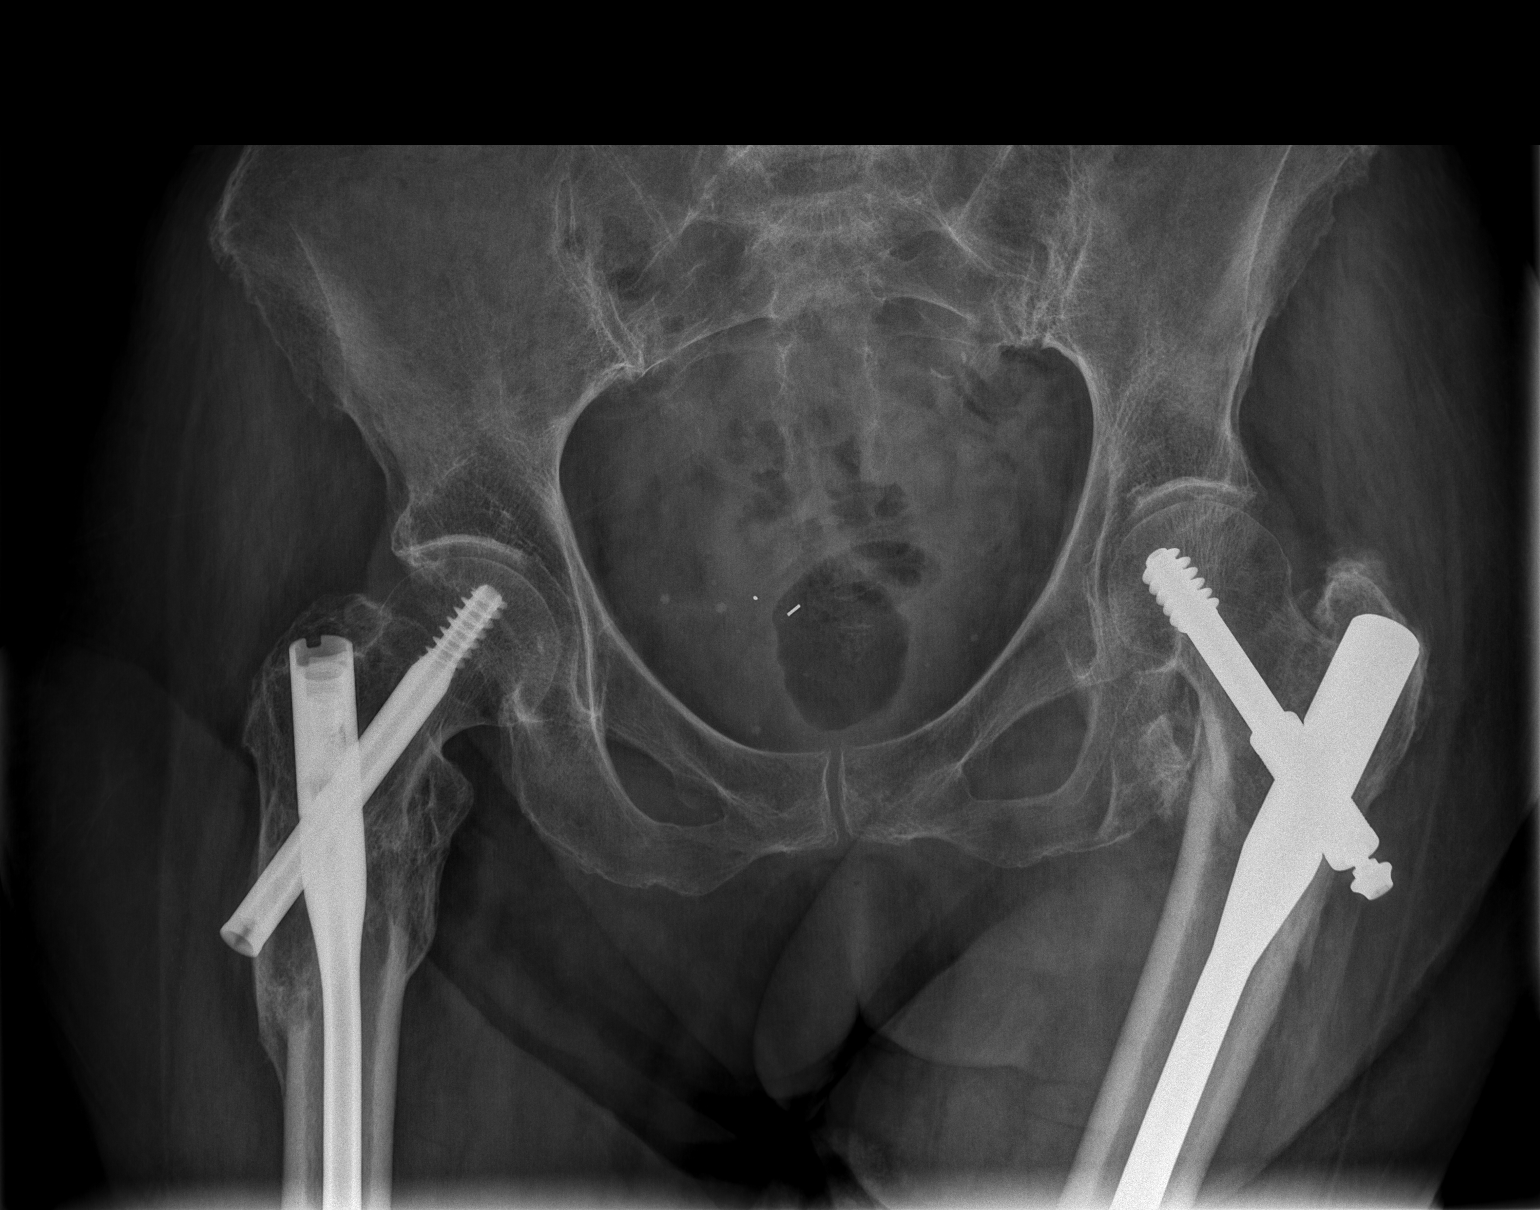

[t hip ap right]
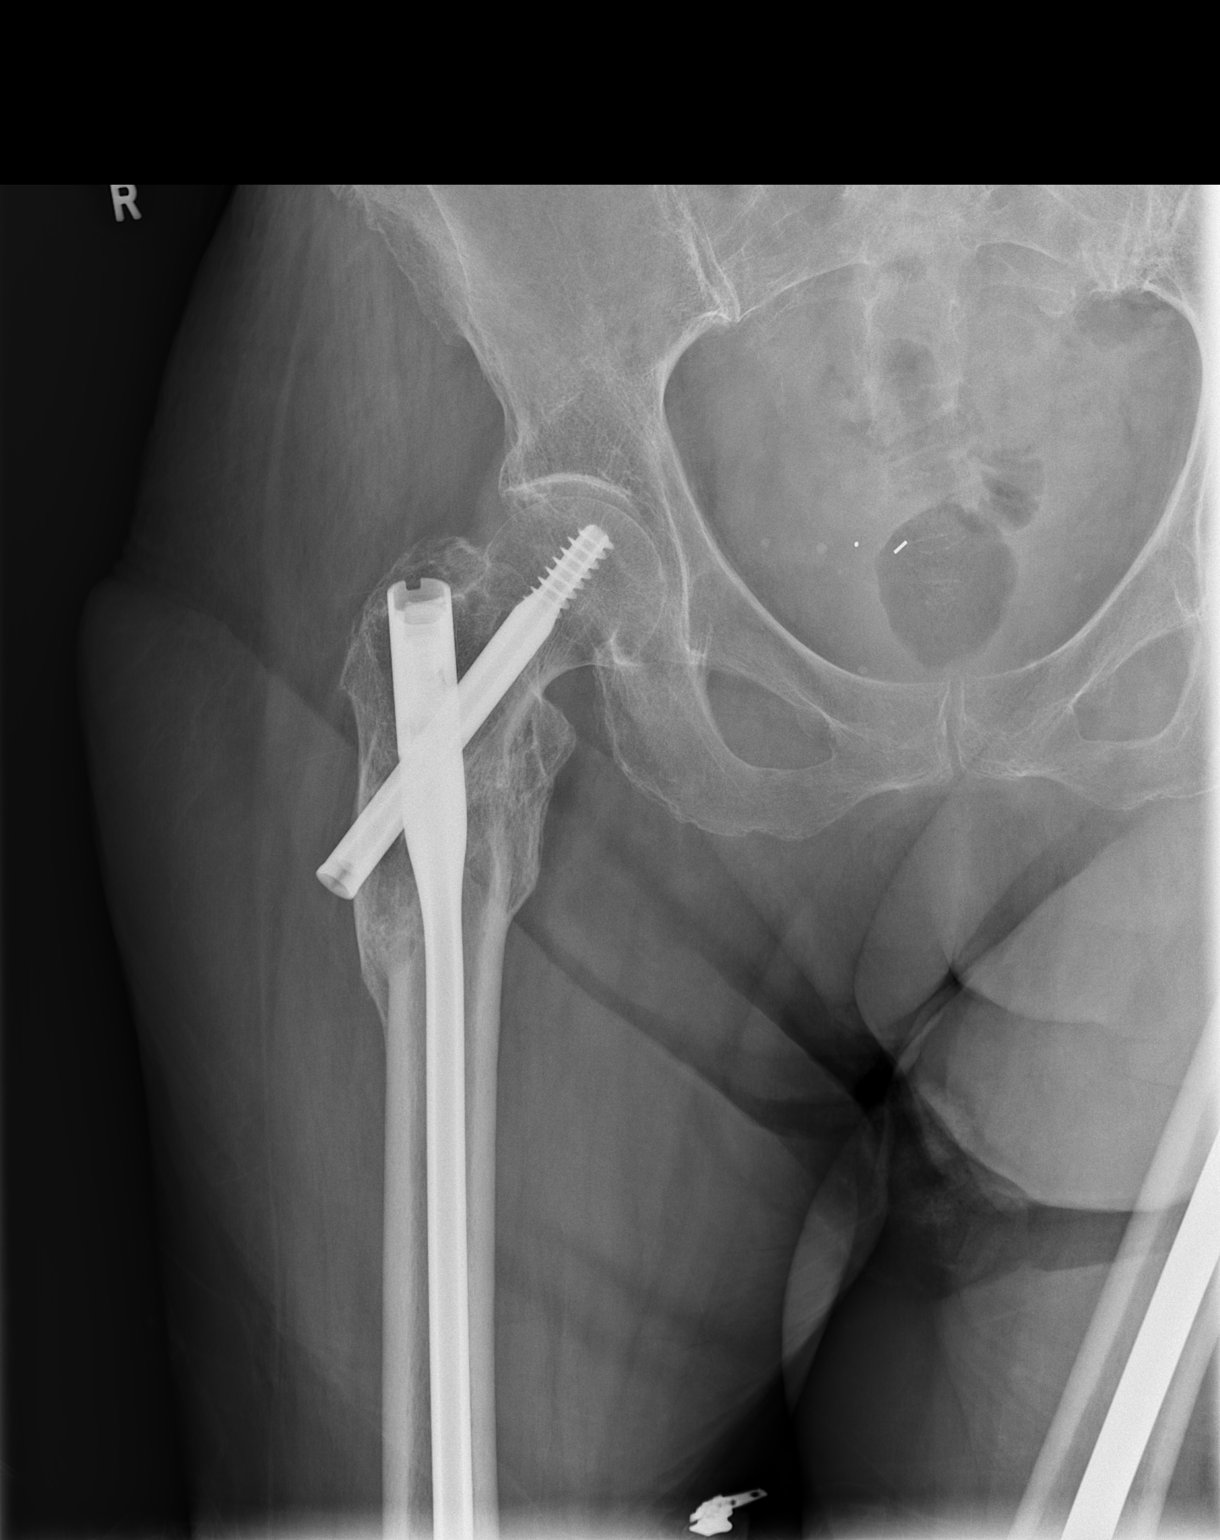

[t hip frog leg right]
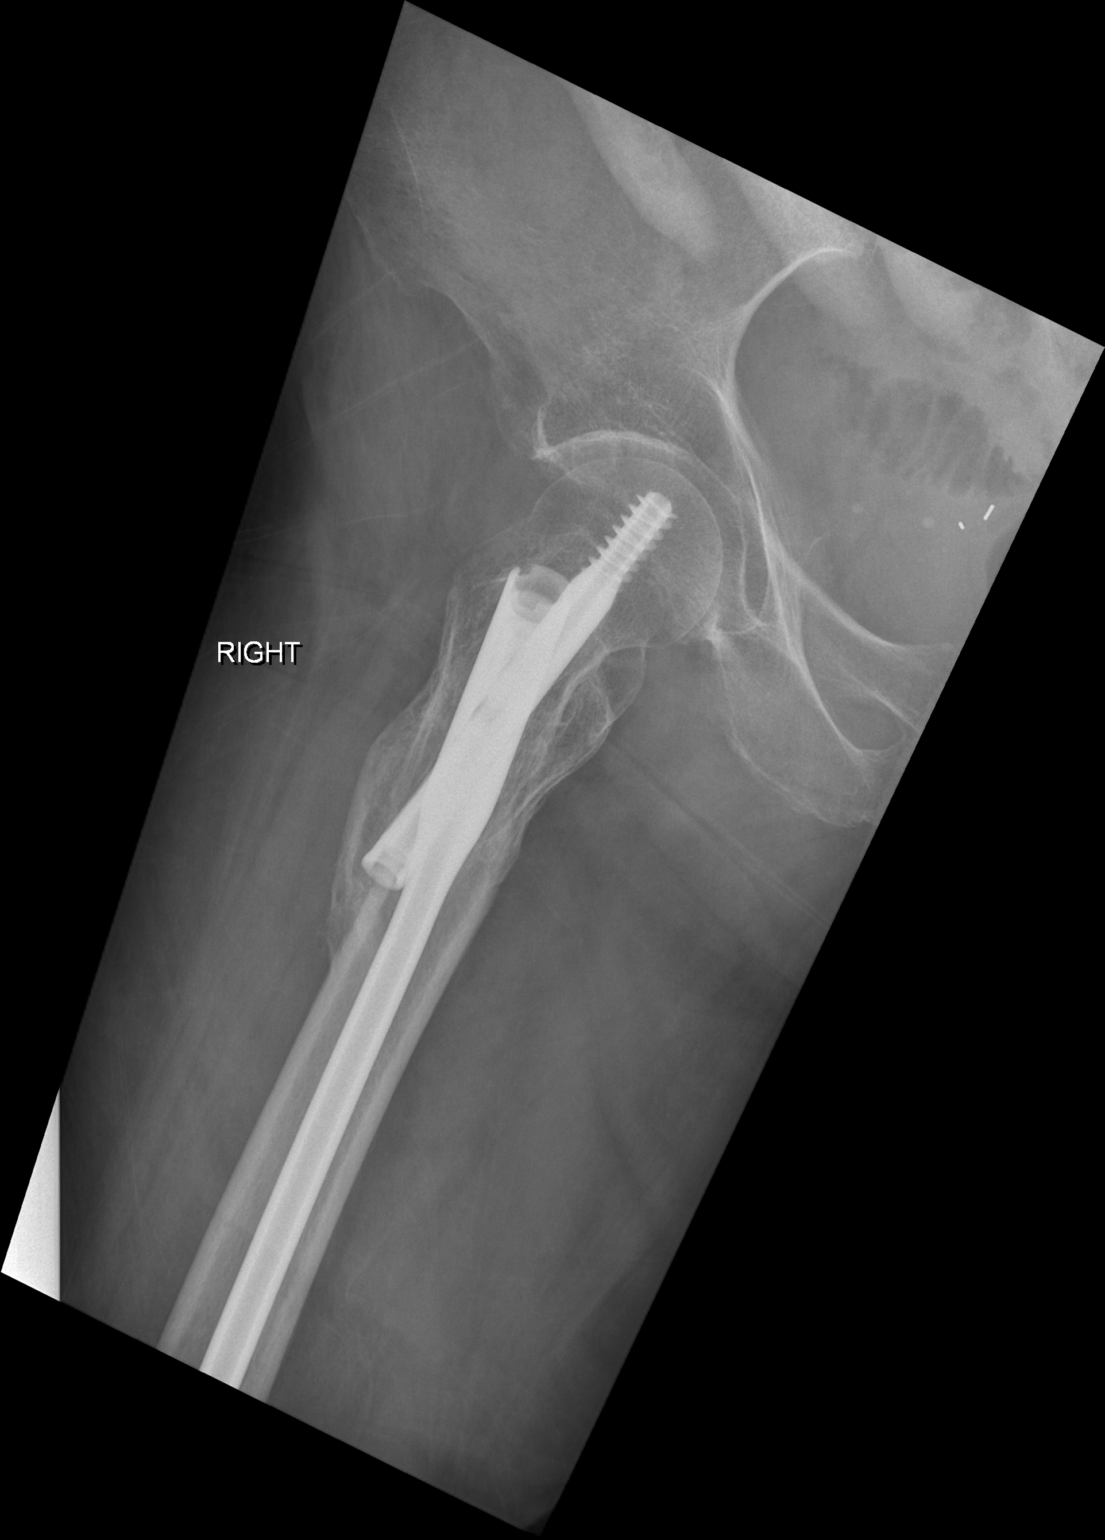

[3 of 3 positions shown; findings below may reference images not displayed]

FINDINGS: Bilateral proximal femoral fixation. Moderate to marked osteopenia.
Femoral heads are located. Remote proximal right femoral fracture.
No hardware complication or acute fracture identified.
IMPRESSION: Postsurgical changes and osteopenia. No acute osseous abnormality.
